# Patient Record
Sex: Female | Born: 1955 | Race: Black or African American | Hispanic: No | Marital: Single | Smoking: Current some day smoker
Health system: Southern US, Community
[De-identification: ages and names within clinical notes are randomized; demographics above are authoritative.]

## PROBLEM LIST (undated history)

## (undated) DIAGNOSIS — E785 Hyperlipidemia, unspecified: Secondary | ICD-10-CM

## (undated) DIAGNOSIS — R030 Elevated blood-pressure reading, without diagnosis of hypertension: Secondary | ICD-10-CM

## (undated) DIAGNOSIS — M1990 Unspecified osteoarthritis, unspecified site: Secondary | ICD-10-CM

## (undated) DIAGNOSIS — T7840XA Allergy, unspecified, initial encounter: Secondary | ICD-10-CM

## (undated) DIAGNOSIS — I10 Essential (primary) hypertension: Secondary | ICD-10-CM

## (undated) DIAGNOSIS — D649 Anemia, unspecified: Secondary | ICD-10-CM

## (undated) DIAGNOSIS — Z720 Tobacco use: Secondary | ICD-10-CM

## (undated) HISTORY — DX: Essential (primary) hypertension: I10

## (undated) HISTORY — PX: COLONOSCOPY: SHX174

## (undated) HISTORY — DX: Elevated blood-pressure reading, without diagnosis of hypertension: R03.0

## (undated) HISTORY — DX: Hyperlipidemia, unspecified: E78.5

## (undated) HISTORY — PX: OTHER SURGICAL HISTORY: SHX169

## (undated) HISTORY — DX: Allergy, unspecified, initial encounter: T78.40XA

---

## 1998-02-21 ENCOUNTER — Emergency Department (HOSPITAL_COMMUNITY): Admission: EM | Admit: 1998-02-21 | Discharge: 1998-02-21 | Payer: Self-pay | Admitting: Emergency Medicine

## 1998-04-15 ENCOUNTER — Emergency Department (HOSPITAL_COMMUNITY): Admission: EM | Admit: 1998-04-15 | Discharge: 1998-04-15 | Payer: Self-pay | Admitting: Emergency Medicine

## 1998-12-01 ENCOUNTER — Emergency Department (HOSPITAL_COMMUNITY): Admission: EM | Admit: 1998-12-01 | Discharge: 1998-12-02 | Payer: Self-pay | Admitting: Emergency Medicine

## 1998-12-02 ENCOUNTER — Encounter: Payer: Self-pay | Admitting: Internal Medicine

## 2006-06-05 ENCOUNTER — Emergency Department (HOSPITAL_COMMUNITY): Admission: EM | Admit: 2006-06-05 | Discharge: 2006-06-05 | Payer: Self-pay | Admitting: Emergency Medicine

## 2010-10-25 ENCOUNTER — Encounter: Payer: Self-pay | Admitting: Internal Medicine

## 2014-10-04 DIAGNOSIS — R030 Elevated blood-pressure reading, without diagnosis of hypertension: Secondary | ICD-10-CM

## 2014-10-04 HISTORY — DX: Elevated blood-pressure reading, without diagnosis of hypertension: R03.0

## 2015-10-16 ENCOUNTER — Ambulatory Visit (INDEPENDENT_AMBULATORY_CARE_PROVIDER_SITE_OTHER): Payer: Self-pay | Admitting: Internal Medicine

## 2015-10-16 ENCOUNTER — Encounter: Payer: Self-pay | Admitting: Internal Medicine

## 2015-10-16 VITALS — BP 158/88 | HR 64 | Resp 16 | Ht 65.5 in | Wt 144.5 lb

## 2015-10-16 DIAGNOSIS — J3089 Other allergic rhinitis: Secondary | ICD-10-CM

## 2015-10-16 NOTE — Progress Notes (Signed)
   Subjective:    Patient ID: Shelley [name], female    DOB: [birth year], 60 y.o.   MRN: [id number]  HPI  1.  Employment physical form needs filling out:  Form scanned in.  2.  Evironmental and seasonal Allergies:  Did not try Fexofenadine.  Has chronic pharyngeal drainage and cough plus itchy watery eyes.  Carpeting throughout house.  No pillow or mattress covers.  No pets in house.    Review of Systems  Constitutional: Negative for activity change and appetite change.  HENT: Positive for postnasal drip.        Ear itching,   Eyes: Positive for redness and itching.       Clear eye discharge  Respiratory: Negative for chest tightness and shortness of breath.   Cardiovascular: Negative for chest pain and palpitations.  Gastrointestinal: Negative for abdominal pain.  Musculoskeletal: Negative for back pain, arthralgias and neck pain.  Neurological: Negative for weakness, light-headedness and numbness.       Objective:   Physical Exam  Constitutional: She is oriented to person, place, and time. She appears well-developed and well-nourished.  Appears anxious with questions and exam  HENT:  Head: Normocephalic and atraumatic.  Eyes: EOM are normal. Pupils are equal, round, and reactive to light. Right eye exhibits discharge.  Conjunctivae injected bilaterally  Neck: Normal range of motion. Neck supple. No thyromegaly present.  Cardiovascular: Normal rate, regular rhythm, normal heart sounds and intact distal pulses.  Exam reveals no friction rub.   No murmur heard. Pulmonary/Chest: Effort normal and breath sounds normal.  Abdominal: Soft. Bowel sounds are normal. She exhibits no mass. There is no tenderness.  Musculoskeletal: Normal range of motion. She exhibits no edema or tenderness.  Lymphadenopathy:    She has no cervical adenopathy.  Neurological: She is alert and oriented to person, place, and time. She has normal reflexes. No cranial nerve deficit. Coordination normal.    Skin: Skin is warm and dry. No erythema.  Psychiatric: Her mood appears anxious.          Assessment & Plan:  1.  Staff Medical Report Form filled out for work.  Pt. Had PPD elsewhere and will bring that in.  No concerns.   2.  Elevated BP again, but patient very anxious. Will have her return in 2 weeks only for bp check.  3.  Anxiety:  Scored much better on PHQ9(3) today compared to previous 10. Warm hand off to [employer], LCSW today.  Suspect patient will not follow up with counseling  4.  Health Maintenance:  Pt. Not interested in preventive care measures today.  Not completely due to concern for more cost.  No interest in pap, mammogram, dental, laboratory at this time.

## 2015-10-16 NOTE — Patient Instructions (Signed)
Can google "advance directives, [place]"  And bring up form from Secretary of State. Print and fill out Or can go to "5 wishes"  Which is also in Spanish and fill out--this costs $5--perhaps easier to use. Designate a Medical Power of Attorney to speak for you if you are unable to speak for yourself when ill or injured  

## 2015-11-07 ENCOUNTER — Other Ambulatory Visit: Payer: No Typology Code available for payment source

## 2017-01-05 ENCOUNTER — Encounter (HOSPITAL_COMMUNITY): Payer: Self-pay | Admitting: Family Medicine

## 2017-01-05 ENCOUNTER — Ambulatory Visit (HOSPITAL_COMMUNITY)
Admission: EM | Admit: 2017-01-05 | Discharge: 2017-01-05 | Disposition: A | Discharge status: Home / Self Care | Payer: Self-pay | Attending: Internal Medicine | Admitting: Internal Medicine

## 2017-01-05 DIAGNOSIS — J069 Acute upper respiratory infection, unspecified: Secondary | ICD-10-CM

## 2017-01-05 MED ORDER — AZITHROMYCIN 250 MG PO TABS: 250.0000 mg | ORAL_TABLET | Freq: Every day | ORAL | 0 refills | Status: DC

## 2017-01-05 MED ORDER — BENZONATATE 100 MG PO CAPS: 100.0000 mg | ORAL_CAPSULE | Freq: Three times a day (TID) | ORAL | 0 refills | Status: DC

## 2017-01-05 NOTE — Discharge Instructions (Signed)

## 2017-01-05 NOTE — ED Notes (Signed)
Patient requested to speak to provider.  Notified provider, provider at bedside

## 2017-01-05 NOTE — ED Provider Notes (Signed)
CSN: [id number]     Arrival date & time 01/05/17  1203 History   First MD Initiated Contact with Patient 01/05/17 1237     Chief Complaint  Patient presents with  . URI   (Consider location/radiation/quality/duration/timing/severity/associated sxs/prior Treatment) HPI Shelley [name] is a 61 y.o. female presenting to UC with c/o cough, congestion, intermittent ear pain, and sore throat for about 4 weeks now. She has taken OTC mucinex with mild relief. Denies sick contacts or recent travel. Denies known fever. No n/v/d. No hx of asthma.    Past Medical History:  Diagnosis Date  . Allergy    Environmental and Seasonal:  Pharyngeal drainage, cough and eye symptoms  . Elevated blood pressure reading without diagnosis of hypertension 2016   Past Surgical History:  Procedure Laterality Date  . 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 Heart disease Mother   . Hypertension Mother   . Congestive Heart Failure Mother     Cause of death  . Congestive Heart Failure Father   . Stroke Father     Cause of death  . Hypertension Father   . Alcohol abuse Brother   . Hypertension Brother   . Heart disease Maternal Grandmother   . Stroke Maternal Grandmother   . Diabetes Sister   . Hypertension Sister   . Heart disease Sister     Arrhythmia and myocarditis/Congenital Heart Disease/possible arrhythmia  . Diabetes Paternal Grandmother   . Diabetes Paternal Grandfather    Social History  Substance Use Topics  . Smoking status: Current Some Day Smoker    Types: Cigarettes    Start date: 10/09/1990  . Smokeless tobacco: Never Used  . Alcohol use 0.0 oz/week     Comment: Couple glasses of wine once to twice weekly   OB History    Gravida Para Term Preterm AB Living   3 2 2   1 2    SAB TAB Ectopic Multiple Live Births   1             Review of Systems  Constitutional: Negative for chills and fever.  HENT:  Positive for congestion, ear pain, postnasal drip, rhinorrhea and sore throat. Negative for trouble swallowing and voice change.   Respiratory: Positive for cough. Negative for shortness of breath.   Cardiovascular: Negative for chest pain and palpitations.  Gastrointestinal: Negative for abdominal pain, diarrhea, nausea and vomiting.  Musculoskeletal: Negative for arthralgias, back pain and myalgias.  Skin: Negative for rash.  Neurological: Positive for headaches. Negative for dizziness and light-headedness.    Allergies  Cephalexin  Home Medications   Prior to Admission medications   Medication Sig Start Date End Date Taking? Authorizing Provider  azithromycin (ZITHROMAX) 250 MG tablet Take 1 tablet (250 mg total) by mouth daily. Take first 2 tablets together, then 1 every day until finished. 01/05/17   Noland Fordyce, PA-C  benzonatate (TESSALON) 100 MG capsule Take 1 capsule (100 mg total) by mouth every 8 (eight) hours. 01/05/17   Noland Fordyce, PA-C   Meds Ordered and Administered this Visit  Medications - No data to display  BP (!) 155/64   Pulse 79   Temp 98.6 F (37 C) (Oral)   Resp 18   LMP 10/04/1998   SpO2 97%  No data found.   Physical Exam  Constitutional: She is oriented to person, place, and time. She appears well-developed and  well-nourished. No distress.  HENT:  Head: Normocephalic and atraumatic.  Right Ear: Tympanic membrane normal.  Left Ear: Tympanic membrane normal.  Nose: Nose normal.  Mouth/Throat: Uvula is midline, oropharynx is clear and moist and mucous membranes are normal.  Eyes: EOM are normal.  Neck: Normal range of motion. Neck supple.  Cardiovascular: Normal rate and regular rhythm.   Pulmonary/Chest: Effort normal. No stridor. No respiratory distress. She has wheezes (faint expiratory wheeze in upper lung field). She has no rales.  Musculoskeletal: Normal range of motion.  Lymphadenopathy:    She has no cervical adenopathy.  Neurological:  She is alert and oriented to person, place, and time.  Skin: Skin is warm and dry. She is not diaphoretic.  Psychiatric: She has a normal mood and affect. Her behavior is normal.  Nursing note and vitals reviewed.   Urgent Care Course     Procedures (including critical care time)  Labs Review Labs Reviewed - No data to display  Imaging Review No results found.    MDM   1. Upper respiratory tract infection, unspecified type    Pt c/o 4 weeks of URI symptoms. Will cover for bacterial cause of URI Rx: azithromycin and tessalon  Encouraged fluids and rest.  f/u with PCP in 1 week if not improving, sooner if worsening.    Noland Fordyce, PA-C 01/05/17 1244

## 2017-01-05 NOTE — ED Triage Notes (Signed)
Pt here for URI symptoms, sts sinus drainage, cough, sore throat.

## 2017-08-03 ENCOUNTER — Ambulatory Visit: Payer: Self-pay | Admitting: Internal Medicine

## 2017-08-06 ENCOUNTER — Emergency Department (HOSPITAL_COMMUNITY)
Admission: EM | Admit: 2017-08-06 | Discharge: 2017-08-06 | Disposition: A | Discharge status: Home / Self Care | Payer: Self-pay | Attending: Emergency Medicine | Admitting: Emergency Medicine

## 2017-08-06 ENCOUNTER — Encounter (HOSPITAL_COMMUNITY): Payer: Self-pay | Admitting: Emergency Medicine

## 2017-08-06 DIAGNOSIS — H109 Unspecified conjunctivitis: Secondary | ICD-10-CM | POA: Insufficient documentation

## 2017-08-06 DIAGNOSIS — F17210 Nicotine dependence, cigarettes, uncomplicated: Secondary | ICD-10-CM | POA: Insufficient documentation

## 2017-08-06 DIAGNOSIS — Z79899 Other long term (current) drug therapy: Secondary | ICD-10-CM | POA: Insufficient documentation

## 2017-08-06 MED ORDER — CIPROFLOXACIN HCL 0.3 % OP SOLN: 2.0000 [drp] | OPHTHALMIC | 0 refills | Status: DC

## 2017-08-06 NOTE — ED Triage Notes (Signed)
Pt. Stated, I have pink eye for over 2 weeks and in my house and I went to UC and gave me drops and its worse instead of better especially right eye

## 2017-08-06 NOTE — ED Provider Notes (Signed)
[facility] EMERGENCY DEPARTMENT Provider Note   CSN: [id number] Arrival date & time: 08/06/17  5852     History   Chief Complaint Chief Complaint  Patient presents with  . Eye Problem  . Conjunctivitis    HPI Shelley [name] is a 61 y.o. female.  HPI   Shelley [name] is a 61 yo female with a history of allergies, hypertension who presents the emergency department for evaluation of pinkeye.  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  States that she developed eye drainage and redness about a week and a half ago.  States that she also passed it along to her daughter at home, who was seen at the urgent care clinic and given antibiotic drops.  She states that she has been using her daughter's antibiotic drops for the past two days without improvement of her symptoms.  States that she has purulent drainage from the left eye and crusting when she wakes up in the morning.  Denies eye pain, photophobia, headache, fever, vision loss.  Denies recent trauma or injury to the eye.  She does not wear contact lenses or eyeglasses.  She states that she has recently had an upper respiratory tract infection with congestion, dry cough.  Past Medical History:  Diagnosis Date  . Allergy    Environmental and Seasonal:  Pharyngeal drainage, cough and eye symptoms  . Elevated blood pressure reading without diagnosis of hypertension 2016    Patient Active Problem List   Diagnosis Date Noted  . Environmental and seasonal allergies 10/16/2015    Past Surgical History:  Procedure Laterality Date  . 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 Authorizing Provider    azithromycin (ZITHROMAX) 250 MG tablet Take 1 tablet (250 mg total) by mouth daily. Take first 2 tablets together, then 1 every day until finished. 01/05/17   Noe Gens, PA-C  benzonatate (TESSALON) 100 MG capsule Take 1 capsule (100 mg total) by mouth every 8 (eight) hours. 01/05/17   Noe Gens, PA-C  ciprofloxacin (CILOXAN) 0.3 % ophthalmic solution Place 2 drops into the left eye every 4 (four) hours while awake. Administer 1 drop, every 2 hours, while awake, for 2 days. Then 1 drop, every 4 hours, while awake, for the next 5 days. 08/06/17   Glyn Ade, PA-C    Family History Family History  Problem Relation Age of Onset  . Heart disease Mother   . Hypertension Mother   . Congestive Heart Failure Mother        Cause of death  . Congestive Heart Failure Father   . Stroke Father        Cause of death  . Hypertension Father   . Alcohol abuse Brother   . Hypertension Brother   . Heart disease Maternal Grandmother   . Stroke Maternal Grandmother   . Diabetes Sister   . Hypertension Sister   . Heart disease Sister        Arrhythmia and  myocarditis/Congenital Heart Disease/possible arrhythmia  . Diabetes Paternal Grandmother   . Diabetes Paternal Grandfather     Social History Social History  Substance Use Topics  . Smoking status: Current Some Day Smoker    Types: Cigarettes    Start date: 10/09/1990  . Smokeless tobacco: Never Used  . Alcohol use 0.0 oz/week     Comment: Couple glasses of wine once to twice weekly     Allergies   Cephalexin   Review of Systems Review of Systems  Constitutional: Negative for chills, fatigue and fever.  HENT: Positive for congestion and rhinorrhea. Negative for ear pain, facial swelling, hearing loss, sinus pain, sinus pressure, sore throat and trouble swallowing.   Eyes: Positive for discharge and redness. Negative for photophobia, pain, itching and visual disturbance.  Respiratory: Positive for cough. Negative for  shortness of breath.   Neurological: Negative for headaches.     Physical Exam Updated Vital Signs BP (!) 143/71 (BP Location: Right Arm)   Pulse 78   Temp 98.4 F (36.9 C) (Oral)   Resp 18   Ht 5\' 11"  (1.803 m)   Wt 85.3 kg (188 lb)   LMP  (LMP Unknown)   SpO2 97%   BMI 26.22 kg/m   Physical Exam  Constitutional: She appears well-developed and well-nourished. No distress.  HENT:  Head: Normocephalic and atraumatic.  Mouth/Throat: Oropharynx is clear and moist. No oropharyngeal exudate.  Clear rhinorrhea noted in the nasal cavity.  Eyes: Pupils are equal, round, and reactive to light. EOM are normal. Right eye exhibits no discharge. Left eye exhibits no discharge.  Visual acuity: 20/32RE, 20/50LE, 20/32 BE. Conjunctiva of right eye injected with copious amount of clear drainage.  Chemosis noted on the lateral aspect of the left eye.  Mild left upper eyelid swelling compared to right.  No eyelid erythema, warmth, tenderness, induration, stye.  No foreign body with eyelids flipped.  Optic disc with sharp margins on funduscopic exam.  Neck: Normal range of motion. Neck supple. No tracheal deviation present.  Cardiovascular: Normal rate and regular rhythm.  Exam reveals no friction rub.   No murmur heard. Pulmonary/Chest: Effort normal and breath sounds normal. No respiratory distress. She has no wheezes. She has no rales.  Lymphadenopathy:    She has no cervical adenopathy.  Neurological: She is alert. Coordination normal.  Skin: She is not diaphoretic.  Psychiatric: She has a normal mood and affect. Her behavior is normal.  Nursing note and vitals reviewed.    ED Treatments / Results  Labs (all labs ordered are listed, but only abnormal results are displayed) Labs Reviewed - No data to display  EKG  EKG Interpretation None       Radiology No results found.  Procedures Procedures (including critical care time)  Medications Ordered in ED Medications - No data to  display   Initial Impression / Assessment and Plan / ED Course  I have reviewed the triage vital signs and the nursing notes.  Pertinent labs & imaging results that were available during my care of the patient were reviewed by me and considered in my medical decision making (see chart for details).     Shelley [name] presents with symptoms consistent with bacterial conjunctivitis.  Purulent discharge exam with chemosis on the lateral aspect of the left eye.  No evidence of preseptal or orbital cellulitis.  Pt is not a contact lens wearer.  Patient will be given ciprofloxacin ophthalmic solution.  Personal hygiene and frequent handwashing discussed.  Patient advised to followup with ophthalmologist for reevaluation in several days. Patient verbalizes understanding and is agreeable with discharge. Dr. Alvino Chapel also saw this patient and agrees with plan.   Final Clinical Impressions(s) / ED Diagnoses   Final diagnoses:  Conjunctivitis of left eye, unspecified conjunctivitis type    New Prescriptions Discharge Medication List as of 08/06/2017 11:18 AM    START taking these medications   Details  ciprofloxacin (CILOXAN) 0.3 % ophthalmic solution Place 2 drops into the left eye every 4 (four) hours while awake. Administer 1 drop, every 2 hours, while awake, for 2 days. Then 1 drop, every 4 hours, while awake, for the next 5 days., Starting Sat 08/06/2017, Print         Glyn Ade, PA-C 08/06/17 1712    Davonna Belling, MD 08/07/17 1655

## 2017-08-06 NOTE — ED Triage Notes (Signed)
Pt. Used some of her daughters eye drops that was prescribed by the UC.

## 2017-08-06 NOTE — ED Notes (Signed)
Declined W/C at D/C and was escorted to lobby by RN. 

## 2017-08-06 NOTE — Discharge Instructions (Signed)
I have written you a prescription for an antibiotic.  Please have this filled and take 2 drops every 4 hours.  If your symptoms are not improving in 3 days, please contact ophthalmology.  I have listed information to the eye doctor below.  Please try to avoid rubbing your eyes, wash your hands frequently and wash your pillows and sheets to prevent reinfection.  I have also listed the information to [facility].  This is a clinic who accept patients who do not have insurance.  They are located near [facility].  Please schedule an appointment to establish care and for follow-up of your blood pressure which was elevated in the ER today.  Return to the emergency department if you develop worsening eye pain, have worsening vision loss, have a headache or fever.  Please also return if you have any new or worsening symptoms.

## 2017-09-22 ENCOUNTER — Ambulatory Visit (INDEPENDENT_AMBULATORY_CARE_PROVIDER_SITE_OTHER): Payer: Self-pay | Admitting: Physician Assistant

## 2017-10-04 DIAGNOSIS — J189 Pneumonia, unspecified organism: Secondary | ICD-10-CM

## 2017-10-04 HISTORY — DX: Pneumonia, unspecified organism: J18.9

## 2018-07-25 ENCOUNTER — Encounter (HOSPITAL_COMMUNITY): Payer: Self-pay | Admitting: Emergency Medicine

## 2018-07-25 ENCOUNTER — Other Ambulatory Visit: Payer: Self-pay

## 2018-07-25 ENCOUNTER — Emergency Department (HOSPITAL_COMMUNITY): Payer: BLUE CROSS/BLUE SHIELD

## 2018-07-25 ENCOUNTER — Inpatient Hospital Stay (HOSPITAL_COMMUNITY)
Admission: EM | Admit: 2018-07-25 | Discharge: 2018-07-28 | DRG: 871 | Disposition: A | Discharge status: Home / Self Care | Payer: BLUE CROSS/BLUE SHIELD | Attending: Internal Medicine | Admitting: Internal Medicine

## 2018-07-25 DIAGNOSIS — F1010 Alcohol abuse, uncomplicated: Secondary | ICD-10-CM | POA: Diagnosis present

## 2018-07-25 DIAGNOSIS — J189 Pneumonia, unspecified organism: Secondary | ICD-10-CM | POA: Diagnosis present

## 2018-07-25 DIAGNOSIS — Z79899 Other long term (current) drug therapy: Secondary | ICD-10-CM

## 2018-07-25 DIAGNOSIS — F17210 Nicotine dependence, cigarettes, uncomplicated: Secondary | ICD-10-CM | POA: Diagnosis present

## 2018-07-25 DIAGNOSIS — R17 Unspecified jaundice: Secondary | ICD-10-CM | POA: Diagnosis present

## 2018-07-25 DIAGNOSIS — Z720 Tobacco use: Secondary | ICD-10-CM | POA: Diagnosis present

## 2018-07-25 DIAGNOSIS — F1090 Alcohol use, unspecified, uncomplicated: Secondary | ICD-10-CM

## 2018-07-25 DIAGNOSIS — Z881 Allergy status to other antibiotic agents status: Secondary | ICD-10-CM | POA: Diagnosis not present

## 2018-07-25 DIAGNOSIS — A419 Sepsis, unspecified organism: Secondary | ICD-10-CM | POA: Diagnosis present

## 2018-07-25 DIAGNOSIS — Z7289 Other problems related to lifestyle: Secondary | ICD-10-CM

## 2018-07-25 DIAGNOSIS — J181 Lobar pneumonia, unspecified organism: Secondary | ICD-10-CM

## 2018-07-25 DIAGNOSIS — Z789 Other specified health status: Secondary | ICD-10-CM

## 2018-07-25 HISTORY — DX: Tobacco use: Z72.0

## 2018-07-25 LAB — COMPREHENSIVE METABOLIC PANEL
ALBUMIN: 4.2 g/dL (ref 3.5–5.0)
ALT: 16 U/L (ref 0–44)
AST: 21 U/L (ref 15–41)
Alkaline Phosphatase: 90 U/L (ref 38–126)
Anion gap: 12 (ref 5–15)
BUN: 10 mg/dL (ref 8–23)
CALCIUM: 9.7 mg/dL (ref 8.9–10.3)
CHLORIDE: 102 mmol/L (ref 98–111)
CO2: 24 mmol/L (ref 22–32)
CREATININE: 0.91 mg/dL (ref 0.44–1.00)
GFR calc non Af Amer: >60 mL/min (ref >60)
GLUCOSE: 107 mg/dL — AB (ref 70–99)
Potassium: 4.1 mmol/L (ref 3.5–5.1)
SODIUM: 138 mmol/L (ref 135–145)
Total Bilirubin: 1.5 mg/dL — ABNORMAL HIGH (ref 0.3–1.2)
Total Protein: 8.2 g/dL — ABNORMAL HIGH (ref 6.5–8.1)

## 2018-07-25 LAB — CBC WITH DIFFERENTIAL/PLATELET
Abs Immature Granulocytes: 0.17 10*3/uL — ABNORMAL HIGH (ref 0.00–0.07)
Basophils Absolute: 0 10*3/uL (ref 0.0–0.1)
Basophils Relative: 0 %
Eosinophils Absolute: 0 10*3/uL (ref 0.0–0.5)
Eosinophils Relative: 0 %
HEMATOCRIT: 45.1 % (ref 36.0–46.0)
HEMOGLOBIN: 14.9 g/dL (ref 12.0–15.0)
IMMATURE GRANULOCYTES: 1 %
LYMPHS ABS: 0.7 10*3/uL (ref 0.7–4.0)
Lymphocytes Relative: 3 %
MCH: 30.8 pg (ref 26.0–34.0)
MCHC: 33 g/dL (ref 30.0–36.0)
MCV: 93.2 fL (ref 80.0–100.0)
MONO ABS: 1.3 10*3/uL — AB (ref 0.1–1.0)
MONOS PCT: 5 %
NEUTROS ABS: 23 10*3/uL — AB (ref 1.7–7.7)
NEUTROS PCT: 91 %
Platelets: 246 10*3/uL (ref 150–400)
RBC: 4.84 MIL/uL (ref 3.87–5.11)
RDW: 13.4 % (ref 11.5–15.5)
WBC: 25.2 10*3/uL — ABNORMAL HIGH (ref 4.0–10.5)
nRBC: 0 % (ref 0.0–0.2)

## 2018-07-25 LAB — PROCALCITONIN: PROCALCITONIN: 2.85 ng/mL

## 2018-07-25 LAB — URINALYSIS, ROUTINE W REFLEX MICROSCOPIC
Bilirubin Urine: NEGATIVE
GLUCOSE, UA: NEGATIVE mg/dL
Ketones, ur: NEGATIVE mg/dL
NITRITE: NEGATIVE
PH: 5 (ref 5.0–8.0)
Protein, ur: NEGATIVE mg/dL
Specific Gravity, Urine: 1.017 (ref 1.005–1.030)

## 2018-07-25 LAB — PROTIME-INR
INR: 1.26
Prothrombin Time: 15.7 seconds — ABNORMAL HIGH (ref 11.4–15.2)

## 2018-07-25 LAB — LACTIC ACID, PLASMA: LACTIC ACID, VENOUS: 1.3 mmol/L (ref 0.5–1.9)

## 2018-07-25 LAB — I-STAT CG4 LACTIC ACID, ED
Lactic Acid, Venous: 2.01 mmol/L (ref 0.5–1.9)
Lactic Acid, Venous: 2.06 mmol/L (ref 0.5–1.9)

## 2018-07-25 LAB — I-STAT TROPONIN, ED: Troponin i, poc: 0.01 ng/mL (ref 0.00–0.08)

## 2018-07-25 LAB — MRSA PCR SCREENING: MRSA by PCR: NEGATIVE

## 2018-07-25 LAB — MAGNESIUM: MAGNESIUM: 1.8 mg/dL (ref 1.7–2.4)

## 2018-07-25 LAB — APTT: APTT: 29 s (ref 24–36)

## 2018-07-25 LAB — PHOSPHORUS: PHOSPHORUS: 3.3 mg/dL (ref 2.5–4.6)

## 2018-07-25 MED ORDER — KETOROLAC TROMETHAMINE 30 MG/ML IJ SOLN
30.0000 mg | Freq: Once | INTRAMUSCULAR | Status: AC
  Administered 2018-07-25: 30 mg via INTRAVENOUS
  Filled 2018-07-25: qty 1

## 2018-07-25 MED ORDER — METHYLPREDNISOLONE SODIUM SUCC 125 MG IJ SOLR
125.0000 mg | Freq: Once | INTRAMUSCULAR | Status: AC
  Administered 2018-07-25: 125 mg via INTRAVENOUS
  Filled 2018-07-25: qty 2

## 2018-07-25 MED ORDER — ONDANSETRON HCL 4 MG/2ML IJ SOLN
4.0000 mg | Freq: Once | INTRAMUSCULAR | Status: AC
  Administered 2018-07-25: 4 mg via INTRAVENOUS
  Filled 2018-07-25: qty 2

## 2018-07-25 MED ORDER — DM-GUAIFENESIN ER 30-600 MG PO TB12
1.0000 | ORAL_TABLET | Freq: Two times a day (BID) | ORAL | Status: DC
  Administered 2018-07-25 – 2018-07-28 (×6): 1 via ORAL
  Filled 2018-07-25 (×6): qty 1

## 2018-07-25 MED ORDER — HEPARIN SODIUM (PORCINE) 5000 UNIT/ML IJ SOLN
5000.0000 [IU] | Freq: Three times a day (TID) | INTRAMUSCULAR | Status: DC
  Administered 2018-07-25 – 2018-07-27 (×5): 5000 [IU] via SUBCUTANEOUS
  Filled 2018-07-25 (×5): qty 1

## 2018-07-25 MED ORDER — SODIUM CHLORIDE 0.9 % IV SOLN
500.0000 mg | Freq: Once | INTRAVENOUS | Status: AC
  Administered 2018-07-25: 500 mg via INTRAVENOUS
  Filled 2018-07-25: qty 500

## 2018-07-25 MED ORDER — SODIUM CHLORIDE 0.9 % IV SOLN
1.0000 g | Freq: Once | INTRAVENOUS | Status: AC
  Administered 2018-07-25: 1 g via INTRAVENOUS
  Filled 2018-07-25: qty 10

## 2018-07-25 MED ORDER — SODIUM CHLORIDE 0.9 % IV BOLUS
1000.0000 mL | Freq: Once | INTRAVENOUS | Status: AC
  Administered 2018-07-25: 1000 mL via INTRAVENOUS

## 2018-07-25 MED ORDER — SODIUM CHLORIDE 0.9 % IV SOLN
1.0000 g | INTRAVENOUS | Status: DC
  Administered 2018-07-26: 1 g via INTRAVENOUS
  Filled 2018-07-25 (×2): qty 10

## 2018-07-25 MED ORDER — ONDANSETRON HCL 4 MG PO TABS: 4.0000 mg | ORAL_TABLET | Freq: Four times a day (QID) | ORAL | Status: DC | PRN

## 2018-07-25 MED ORDER — SODIUM CHLORIDE 0.9 % IV BOLUS
1500.0000 mL | Freq: Once | INTRAVENOUS | Status: AC
  Administered 2018-07-25: 1000 mL via INTRAVENOUS

## 2018-07-25 MED ORDER — SODIUM CHLORIDE 0.9 % IV SOLN
500.0000 mg | INTRAVENOUS | Status: DC
  Administered 2018-07-26: 500 mg via INTRAVENOUS
  Filled 2018-07-25 (×2): qty 500

## 2018-07-25 MED ORDER — IPRATROPIUM-ALBUTEROL 0.5-2.5 (3) MG/3ML IN SOLN: 3.0000 mL | RESPIRATORY_TRACT | Status: DC | PRN

## 2018-07-25 MED ORDER — LORAZEPAM 2 MG/ML IJ SOLN: 2.0000 mg | INTRAMUSCULAR | Status: DC | PRN

## 2018-07-25 MED ORDER — ONDANSETRON HCL 4 MG/2ML IJ SOLN: 4.0000 mg | Freq: Four times a day (QID) | INTRAMUSCULAR | Status: DC | PRN

## 2018-07-25 NOTE — ED Triage Notes (Signed)
Pt states productive cough for 3 weeks with white phlegm. She states she has been dizzy since yesterday, feeling like she will fall when she stands. States this morning she woke up with a fever and chills. Denies chest pain. Does endorse some shortness of breath. Smokes daily.

## 2018-07-25 NOTE — H&P (Signed)
History and Physical    Shelley [name] ZOX:[id number] DOB: 05/26/1956 DOA: 07/25/2018  PCP: Mack Hook, MD   Patient coming from: Home.  I have personally briefly reviewed patient's old medical records in [place]  Chief Complaint: Shortness of breath.  HPI: Shelley [name] is a 61 y.o. female with medical history significant of allergies, tobacco use, elevated blood pressure without a formal diagnosis of hypertension who is coming to the emergency department with complaints of progressively worse dyspnea for the past 3 weeks.  She states that about 3 weeks ago she had sore throat for 1 day associated with a dry cough.  She states that she "was unable to get over the cold" and develop a productive cough of yellowish sputum.  She continues to smoke daily and has had wheezing.  She has been having fever, chills with malaise since this morning.  She has pleuritic chest pain.  She has been feeling dizzy since yesterday.  She denies hemoptysis, PND, or pitting edema of the lower extremities.  She denies abdominal pain, but had one episode of emesis while being interviewed and examined.  No diarrhea or constipation.  She has had mild dysuria, but denies frequency or hematuria.  No polyuria, polydipsia, polyphagia or blurred vision.  ED Course: Initial vital signs temperature 102.1, degrees Fahrenheit, pulse 121, respirations 18, blood pressure 154/89 mmHg and O2 sat 93% on room air.  The patient received a 1 L normal saline bolus, ceftriaxone and azithromycin IVPB.  I added Zofran 4 mg IVP x1, methylprednisolone 125 mg IVP x1, a 1500 mL NS bolus and Toradol 30 mg IVP x1 dose.  Her urinalysis was cloudy in appearance with small hemoglobinuria and small leukocyte esterase.  11-20 WBC per hpf with many bacteria.  White count was 25.2 with 91% neutrophils, hemoglobin 14.9 g/dL and platelets 246.  Her CMP shows a glucose of 107, total bilirubin of 1.5 mg/dL.  Total protein was 8.2 g/dL.  All  other CMP values are within normal limits.  Lactic acid # 1 2.01 and lactic acid # 2 was 2.06 mmol/L.Her chest radiograph showed patchy infiltrate consistent with pneumonia in the superior aspect of the right middle lobe.  Lungs elsewhere are clear.  Review of Systems: As per HPI otherwise 10 point review of systems negative.   Past Medical History:  Diagnosis Date  . Allergy    Environmental and Seasonal:  Pharyngeal drainage, cough and eye symptoms  . Elevated blood pressure reading without diagnosis of hypertension 2016  . Tobacco use     Past Surgical History:  Procedure Laterality Date  . 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 She started smoking about 27 years ago. She has never used smokeless tobacco. She reports that she drinks about 14.0 standard drinks of alcohol per week. She reports that she does not use drugs.  Allergies  Allergen Reactions  . Cephalexin Diarrhea    Developed diarrhea after only 2 doses.    Family History  Problem Relation Age of Onset  . Heart disease Mother   . Hypertension Mother   . Congestive Heart Failure Mother        Cause of death  . Congestive Heart Failure Father   . Stroke Father        Cause of death  . Hypertension Father   . Alcohol abuse Brother   . Hypertension Brother   .  Heart disease Maternal Grandmother   . Stroke Maternal Grandmother   . Diabetes Sister   . Hypertension Sister   . Heart disease Sister        Arrhythmia and myocarditis/Congenital Heart Disease/possible arrhythmia  . Diabetes Paternal Grandmother   . Diabetes Paternal Grandfather    Prior to Admission medications   Medication Sig Start Date End Date Taking? Authorizing Provider  DM-Doxylamine-Acetaminophen (NYQUIL COLD & FLU PO) Take 22.5 mLs by mouth as needed (cold and cough).   Yes [provider]  azithromycin (ZITHROMAX) 250 MG tablet Take 1 tablet (250 mg  total) by mouth daily. Take first 2 tablets together, then 1 every day until finished. Patient not taking: Reported on 07/25/2018 01/05/17   Noe Gens, PA-C  benzonatate (TESSALON) 100 MG capsule Take 1 capsule (100 mg total) by mouth every 8 (eight) hours. Patient not taking: Reported on 07/25/2018 01/05/17   Noe Gens, PA-C  ciprofloxacin (CILOXAN) 0.3 % ophthalmic solution Place 2 drops into the left eye every 4 (four) hours while awake. Administer 1 drop, every 2 hours, while awake, for 2 days. Then 1 drop, every 4 hours, while awake, for the next 5 days. Patient not taking: Reported on 07/25/2018 08/06/17   Glyn Ade, PA-C    Physical Exam: Vitals:   07/25/18 1600 07/25/18 1630 07/25/18 1818 07/25/18 1825  BP: 115/63 125/66  133/69  Pulse: (!) 109 (!) 106    Resp: 19 20    Temp:    98.2 F (36.8 C)  TempSrc:    Oral  SpO2: 99% 98%    Weight:   60.8 kg   Height:   5\' 6"  (1.676 m)     Constitutional:  Febrile.  Looks acutely ill. Eyes: PERRL, lids and conjunctivae are injected. ENMT: Mucous membranes are dry. Posterior pharynx clear of any exudate or lesions. Neck: normal, supple, no masses, no thyromegaly Respiratory: Tachypneic at 28 bpm.  Bibasilar crackles with bilateral wheezing and rhonchi.  No accessory muscle use.  Cardiovascular: Tachycardic at 120 bpm, no murmurs / rubs / gallops. No extremity edema. 2+ pedal pulses. No carotid bruits.  Abdomen: Soft, mild epigastric tenderness, no masses palpated. No hepatosplenomegaly. Bowel sounds positive.  Musculoskeletal: no clubbing / cyanosis. Good ROM, no contractures. Normal muscle tone.  Skin: no rashes, lesions, ulcers. No induration on limited dermatological examination. Neurologic: CN 2-12 grossly intact. Sensation intact, DTR normal. Strength 5/5 in all 4.  Psychiatric: Normal judgment and insight. Alert and oriented x 4.  Mildly anxious mood.   Labs on Admission: I have personally reviewed following labs  and imaging studies  CBC: Recent Labs  Lab 07/25/18 0856  WBC 25.2*  NEUTROABS 23.0*  HGB 14.9  HCT 45.1  MCV 93.2  PLT 716   Basic Metabolic Panel: Recent Labs  Lab 07/25/18 0856  NA 138  K 4.1  CL 102  CO2 24  GLUCOSE 107*  BUN 10  CREATININE 0.91  CALCIUM 9.7   GFR: Estimated Creatinine Clearance: 59.2 mL/min (by C-G formula based on SCr of 0.91 mg/dL). Liver Function Tests: Recent Labs  Lab 07/25/18 0856  AST 21  ALT 16  ALKPHOS 90  BILITOT 1.5*  PROT 8.2*  ALBUMIN 4.2   No results for input(s): LIPASE, AMYLASE in the last 168 hours. No results for input(s): AMMONIA in the last 168 hours. Coagulation Profile: Recent Labs  Lab 07/25/18 1544  INR 1.26   Cardiac Enzymes: No results for input(s): CKTOTAL, CKMB,  CKMBINDEX, TROPONINI in the last 168 hours. BNP (last 3 results) No results for input(s): PROBNP in the last 8760 hours. HbA1C: No results for input(s): HGBA1C in the last 72 hours. CBG: No results for input(s): GLUCAP in the last 168 hours. Lipid Profile: No results for input(s): CHOL, HDL, LDLCALC, TRIG, CHOLHDL, LDLDIRECT in the last 72 hours. Thyroid Function Tests: No results for input(s): TSH, T4TOTAL, FREET4, T3FREE, THYROIDAB in the last 72 hours. Anemia Panel: No results for input(s): VITAMINB12, FOLATE, FERRITIN, TIBC, IRON, RETICCTPCT in the last 72 hours. Urine analysis:    Component Value Date/Time   COLORURINE YELLOW 07/25/2018 0851   APPEARANCEUR CLOUDY (A) 07/25/2018 0851   LABSPEC 1.017 07/25/2018 0851   PHURINE 5.0 07/25/2018 0851   GLUCOSEU NEGATIVE 07/25/2018 0851   HGBUR SMALL (A) 07/25/2018 0851   BILIRUBINUR NEGATIVE 07/25/2018 0851   KETONESUR NEGATIVE 07/25/2018 0851   PROTEINUR NEGATIVE 07/25/2018 0851   NITRITE NEGATIVE 07/25/2018 0851   LEUKOCYTESUR SMALL (A) 07/25/2018 0851    Radiological Exams on Admission: Dg Chest 2 View  Result Date: 07/25/2018 CLINICAL DATA:  Cough and dizziness EXAM: CHEST - 2  VIEW COMPARISON:  None. FINDINGS: There is patchy infiltrate in the superior aspect of the right middle lobe. Lungs elsewhere are clear. Heart size and pulmonary vascularity are normal. No adenopathy. There are foci of degenerative change in the midthoracic region. IMPRESSION: Patchy infiltrate, consistent with pneumonia in the superior aspect of the right middle lobe. Lungs elsewhere clear. No adenopathy evident. Followup PA and lateral chest radiographs recommended in 3-4 weeks following trial of antibiotic therapy to ensure resolution and exclude underlying malignancy. Electronically Signed   By: Lowella Grip III M.D.   On: 07/25/2018 09:38    EKG: Independently reviewed.  Vent. rate 126 BPM PR interval 114 ms QRS duration 80 ms QT/QTc 308/446 ms P-R-T axes 75 41 80 Sinus tachycardia Right atrial enlargement Borderline ECG  Assessment/Plan Principal Problem:   CAP (community acquired pneumonia) Currently the patient looks very toxic tachypneic in the mid 30s, tachycardic in the 130s and had an episode of emesis while being examined.  Admit to stepdown/inpatient. Continue supplemental oxygen. Continue bronchodilators as needed. Mucinex DM p.o. twice daily. Continue IV fluids. Continue ceftriaxone and azithromycin. Check strep pneumonia urinary antigen. Check sputum Gram stain, culture and sensitivity. Follow-up blood cultures and sensitivity.  Active Problems:   Tobacco use Nicotine replacement therapy ordered. Staff to provide tobacco cessation information    Elevated bilirubin Likely due to EtOH abuse. Trend level daily.    Alcohol use CiWA protocol started.   DVT prophylaxis: Heparin SQ. Code Status: Full code. Family Communication:  Disposition Plan: Admit for IV antibiotics for at least 2 to 3 days. Consults called:  Admission status: Inpatient/stepdown.   Reubin Milan MD [facility] Pager [phone].  If 7PM-7AM, please contact  night-coverage [email or web address] Password [facility]  07/25/2018, 6:28 PM

## 2018-07-25 NOTE — ED Notes (Signed)
Date and time results received: 07/25/18 0913   Test: Lactic acid Critical Value: 2.01  Name of Provider Notified: Dr. Francia Greaves   Orders Received? Or Actions Taken?: EDP to see patient.

## 2018-07-25 NOTE — ED Notes (Signed)
Positive I-stat Lactic Acids results given to Meda Coffee, RN

## 2018-07-25 NOTE — ED Provider Notes (Signed)
[facility] EMERGENCY DEPARTMENT Provider Note   CSN: [id number] Arrival date & time: 07/25/18  9528     History   Chief Complaint Chief Complaint  Patient presents with  . Dizziness  . Weakness    HPI Shelley [name] is a 62 y.o. female.  The history is provided by the patient. No language interpreter was used.  Dizziness  Quality:  Lightheadedness Severity:  Moderate Duration:  3 weeks Timing:  Intermittent Progression:  Worsening Chronicity:  New Relieved by:  Nothing Worsened by:  Nothing Ineffective treatments:  None tried Associated symptoms: weakness   Weakness  Primary symptoms include dizziness.  Pt complains of shortness of breath with coughing.  Pt reports she feels weak   Past Medical History:  Diagnosis Date  . Allergy    Environmental and Seasonal:  Pharyngeal drainage, cough and eye symptoms  . Elevated blood pressure reading without diagnosis of hypertension 2016    Patient Active Problem List   Diagnosis Date Noted  . Environmental and seasonal allergies 10/16/2015    Past Surgical History:  Procedure Laterality Date  . 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 Authorizing Provider  DM-Doxylamine-Acetaminophen (NYQUIL COLD & FLU PO) Take 22.5 mLs by mouth as needed (cold and cough).   Yes [provider]  azithromycin (ZITHROMAX) 250 MG tablet Take 1 tablet (250 mg total) by mouth daily. Take first 2 tablets together, then 1 every day until finished. Patient not taking: Reported on 07/25/2018 01/05/17   Noe Gens, PA-C  benzonatate (TESSALON) 100 MG capsule Take 1 capsule (100 mg total) by mouth every 8  (eight) hours. Patient not taking: Reported on 07/25/2018 01/05/17   Noe Gens, PA-C  ciprofloxacin (CILOXAN) 0.3 % ophthalmic solution Place 2 drops into the left eye every 4 (four) hours while awake. Administer 1 drop, every 2 hours, while awake, for 2 days. Then 1 drop, every 4 hours, while awake, for the next 5 days. Patient not taking: Reported on 07/25/2018 08/06/17   Glyn Ade, PA-C    Family History Family History  Problem Relation Age of Onset  . Heart disease Mother   . Hypertension Mother   . Congestive Heart Failure Mother        Cause of death  . Congestive Heart Failure Father   . Stroke Father        Cause of death  . Hypertension Father   . Alcohol abuse Brother   . Hypertension Brother   . Heart disease Maternal Grandmother   . Stroke Maternal Grandmother   . Diabetes Sister   . Hypertension Sister   . Heart disease Sister        Arrhythmia and myocarditis/Congenital Heart Disease/possible arrhythmia  . Diabetes Paternal Grandmother   . Diabetes Paternal Grandfather     Social History Social History   Tobacco Use  .  Smoking status: Current Some Day Smoker    Types: Cigarettes    Start date: 10/09/1990  . Smokeless tobacco: Never Used  Substance Use Topics  . Alcohol use: Yes    Alcohol/week: 0.0 standard drinks    Comment: Couple glasses of wine once to twice weekly  . Drug use: No     Allergies   Cephalexin   Review of Systems Review of Systems  Neurological: Positive for dizziness and weakness.  All other systems reviewed and are negative.    Physical Exam Updated Vital Signs BP (!) 125/52   Pulse (!) 106   Temp (!) 102.1 F (38.9 C) (Oral)   Resp 19   LMP  (LMP Unknown)   SpO2 95%   Physical Exam  Constitutional: She is oriented to person, place, and time. She appears well-developed and well-nourished.  HENT:  Head: Normocephalic.  Right Ear: External ear normal.  Eyes: Pupils are equal, round, and reactive to light.  EOM are normal.  Neck: Normal range of motion.  Cardiovascular: Normal rate and regular rhythm.  Pulmonary/Chest: Effort normal.  Abdominal: She exhibits no distension.  Musculoskeletal: Normal range of motion.  Neurological: She is alert and oriented to person, place, and time.  Skin: Skin is warm.  Psychiatric: She has a normal mood and affect.  Nursing note and vitals reviewed.    ED Treatments / Results  Labs (all labs ordered are listed, but only abnormal results are displayed) Labs Reviewed  URINALYSIS, ROUTINE W REFLEX MICROSCOPIC - Abnormal; Notable for the following components:      Result Value   APPearance CLOUDY (*)    Hgb urine dipstick SMALL (*)    Leukocytes, UA SMALL (*)    Bacteria, UA MANY (*)    All other components within normal limits  CBC WITH DIFFERENTIAL/PLATELET - Abnormal; Notable for the following components:   WBC 25.2 (*)    Neutro Abs 23.0 (*)    Monocytes Absolute 1.3 (*)    Abs Immature Granulocytes 0.17 (*)    All other components within normal limits  COMPREHENSIVE METABOLIC PANEL - Abnormal; Notable for the following components:   Glucose, Bld 107 (*)    Total Protein 8.2 (*)    Total Bilirubin 1.5 (*)    All other components within normal limits  I-STAT CG4 LACTIC ACID, ED - Abnormal; Notable for the following components:   Lactic Acid, Venous 2.01 (*)    All other components within normal limits  I-STAT CG4 LACTIC ACID, ED - Abnormal; Notable for the following components:   Lactic Acid, Venous 2.06 (*)    All other components within normal limits  CBG MONITORING, ED  I-STAT TROPONIN, ED    EKG EKG Interpretation  Date/Time:  Tuesday July 25 2018 08:43:11 EDT Ventricular Rate:  126 PR Interval:  114 QRS Duration: 80 QT Interval:  308 QTC Calculation: 446 R Axis:   41 Text Interpretation:  Sinus tachycardia Right atrial enlargement Borderline ECG Confirmed by Dene Gentry [phone]) on 07/25/2018 10:30:43  AM   Radiology Dg Chest 2 View  Result Date: 07/25/2018 CLINICAL DATA:  Cough and dizziness EXAM: CHEST - 2 VIEW COMPARISON:  None. FINDINGS: There is patchy infiltrate in the superior aspect of the right middle lobe. Lungs elsewhere are clear. Heart size and pulmonary vascularity are normal. No adenopathy. There are foci of degenerative change in the midthoracic region. IMPRESSION: Patchy infiltrate, consistent with pneumonia in the superior aspect of the right middle lobe. Lungs elsewhere clear.  No adenopathy evident. Followup PA and lateral chest radiographs recommended in 3-4 weeks following trial of antibiotic therapy to ensure resolution and exclude underlying malignancy. Electronically Signed   By: Lowella Grip III M.D.   On: 07/25/2018 09:38    Procedures Procedures (including critical care time)  Medications Ordered in ED Medications  sodium chloride 0.9 % bolus 1,000 mL (0 mLs Intravenous Stopped 07/25/18 1306)  cefTRIAXone (ROCEPHIN) 1 g in sodium chloride 0.9 % 100 mL IVPB (0 g Intravenous Stopped 07/25/18 1200)  azithromycin (ZITHROMAX) 500 mg in sodium chloride 0.9 % 250 mL IVPB (0 mg Intravenous Stopped 07/25/18 1232)     Initial Impression / Assessment and Plan / ED Course  I have reviewed the triage vital signs and the nursing notes.  Pertinent labs & imaging results that were available during my care of the patient were reviewed by me and considered in my medical decision making (see chart for details).     MDM  Pt has a temp of 102.  Chest xray shows pneumonia.  Pt placed on 02 at 2 liters.  Pt given rocephin 1 gram IV zithromax 500mg .  I spoke to Dr. Olevia Bowens [facility] call for unassigned who will admit  Final Clinical Impressions(s) / ED Diagnoses   Final diagnoses:  Community acquired pneumonia, unspecified laterality    ED Discharge Orders    None       Sidney Ace 07/25/18 1405    Valarie Merino, MD 07/25/18 [phone]

## 2018-07-25 NOTE — ED Notes (Signed)
Placed on 2L O2 [place] per Marcene Brawn, [place]

## 2018-07-26 LAB — CBC WITH DIFFERENTIAL/PLATELET
ABS IMMATURE GRANULOCYTES: 0.49 10*3/uL — AB (ref 0.00–0.07)
Basophils Absolute: 0 10*3/uL (ref 0.0–0.1)
Basophils Relative: 0 %
Eosinophils Absolute: 0.1 10*3/uL (ref 0.0–0.5)
Eosinophils Relative: 0 %
HEMATOCRIT: 39.4 % (ref 36.0–46.0)
HEMOGLOBIN: 13.1 g/dL (ref 12.0–15.0)
Immature Granulocytes: 2 %
LYMPHS ABS: 0.9 10*3/uL (ref 0.7–4.0)
Lymphocytes Relative: 3 %
MCH: 31 pg (ref 26.0–34.0)
MCHC: 33.2 g/dL (ref 30.0–36.0)
MCV: 93.4 fL (ref 80.0–100.0)
MONO ABS: 0.3 10*3/uL (ref 0.1–1.0)
MONOS PCT: 1 %
NEUTROS ABS: 27 10*3/uL — AB (ref 1.7–7.7)
NEUTROS PCT: 94 %
Platelets: 207 10*3/uL (ref 150–400)
RBC: 4.22 MIL/uL (ref 3.87–5.11)
RDW: 13.8 % (ref 11.5–15.5)
WBC MORPHOLOGY: INCREASED
WBC: 28.8 10*3/uL — ABNORMAL HIGH (ref 4.0–10.5)
nRBC: 0 % (ref 0.0–0.2)

## 2018-07-26 LAB — COMPREHENSIVE METABOLIC PANEL
ALBUMIN: 3.2 g/dL — AB (ref 3.5–5.0)
ALT: 25 U/L (ref 0–44)
ANION GAP: 8 (ref 5–15)
AST: 30 U/L (ref 15–41)
Alkaline Phosphatase: 68 U/L (ref 38–126)
BILIRUBIN TOTAL: 0.8 mg/dL (ref 0.3–1.2)
BUN: 16 mg/dL (ref 8–23)
CO2: 24 mmol/L (ref 22–32)
CREATININE: 0.97 mg/dL (ref 0.44–1.00)
Calcium: 9 mg/dL (ref 8.9–10.3)
Chloride: 108 mmol/L (ref 98–111)
GFR calc non Af Amer: >60 mL/min (ref >60)
GLUCOSE: 151 mg/dL — AB (ref 70–99)
Potassium: 4.2 mmol/L (ref 3.5–5.1)
Sodium: 140 mmol/L (ref 135–145)
TOTAL PROTEIN: 7.2 g/dL (ref 6.5–8.1)

## 2018-07-26 LAB — HIV ANTIBODY (ROUTINE TESTING W REFLEX): HIV Screen 4th Generation wRfx: NONREACTIVE

## 2018-07-26 LAB — STREP PNEUMONIAE URINARY ANTIGEN: Strep Pneumo Urinary Antigen: NEGATIVE

## 2018-07-26 MED ORDER — FOLIC ACID 1 MG PO TABS
1.0000 mg | ORAL_TABLET | Freq: Every day | ORAL | Status: DC
  Administered 2018-07-27 – 2018-07-28 (×2): 1 mg via ORAL
  Filled 2018-07-26 (×2): qty 1

## 2018-07-26 MED ORDER — VITAMIN B-1 100 MG PO TABS
100.0000 mg | ORAL_TABLET | Freq: Every day | ORAL | Status: DC
  Administered 2018-07-27 – 2018-07-28 (×2): 100 mg via ORAL
  Filled 2018-07-26 (×2): qty 1

## 2018-07-26 MED ORDER — LORAZEPAM 1 MG PO TABS: 1.0000 mg | ORAL_TABLET | Freq: Four times a day (QID) | ORAL | Status: DC | PRN

## 2018-07-26 MED ORDER — ADULT MULTIVITAMIN W/MINERALS CH
1.0000 | ORAL_TABLET | Freq: Every day | ORAL | Status: DC
  Administered 2018-07-27 – 2018-07-28 (×2): 1 via ORAL
  Filled 2018-07-26 (×2): qty 1

## 2018-07-26 MED ORDER — THIAMINE HCL 100 MG/ML IJ SOLN
100.0000 mg | Freq: Every day | INTRAMUSCULAR | Status: DC
  Filled 2018-07-26: qty 2

## 2018-07-26 MED ORDER — BENZONATATE 100 MG PO CAPS
100.0000 mg | ORAL_CAPSULE | Freq: Three times a day (TID) | ORAL | Status: DC
  Administered 2018-07-26 – 2018-07-28 (×6): 100 mg via ORAL
  Filled 2018-07-26 (×6): qty 1

## 2018-07-26 MED ORDER — LORAZEPAM 2 MG/ML IJ SOLN: 1.0000 mg | Freq: Four times a day (QID) | INTRAMUSCULAR | Status: DC | PRN

## 2018-07-26 MED ORDER — ACETAMINOPHEN 325 MG PO TABS
650.0000 mg | ORAL_TABLET | Freq: Four times a day (QID) | ORAL | Status: DC | PRN
  Administered 2018-07-26: 650 mg via ORAL
  Filled 2018-07-26: qty 2

## 2018-07-26 NOTE — Progress Notes (Signed)
[facility] Progress Note  Patient: Shelley [name]:[id number]   PCP: Mack Hook, MD DOB: Aug 11, 1956   DOA: 07/25/2018   DOS: 07/26/2018   Date of Service: the patient was seen and examined on 07/26/2018  Brief hospital course: Pt. with PMH of alcohol use, tobacco use, HTN; admitted on 07/25/2018, presented with complaint of shortness of breath, was found to have community-acquired pneumonia. Currently further plan is continue IV antibiotics.  Subjective: Feeling better, cough is better.  No nausea or vomiting no fever no chills.  No chest pain.  Assessment and Plan:  CAP (community acquired pneumonia) Severe bandemia Currently the patient looks very toxic tachypneic in the mid 30s, tachycardic in the 130s and had an episode of emesis while being examined.    Clinically getting better but still remains severely ill Continue supplemental oxygen. Continue bronchodilators as needed. Mucinex DM p.o. twice daily. Continue IV fluids. Continue ceftriaxone and azithromycin. Negative strep pneumonia urinary antigen. Check sputum Gram stain, culture and sensitivity. Follow-up blood cultures and sensitivity.  Active Problems:   Tobacco use Nicotine replacement therapy ordered. Staff to provide tobacco cessation information    Elevated bilirubin Likely due to EtOH abuse. Trend level daily.    Alcohol use CiWA protocol started.  Diet: Regular diet DVT Prophylaxis: subcutaneous Heparin  Advance goals of care discussion: full code  Family Communication: no family was present at bedside, at the time of interview.   Disposition:  Discharge to home.  Consultants: none Procedures: none  Scheduled Meds: . benzonatate  100 mg Oral TID  . dextromethorphan-guaiFENesin  1 tablet Oral BID  . folic acid  1 mg Oral Daily  . heparin  5,000 Units Subcutaneous Q8H  . multivitamin with minerals  1 tablet Oral Daily  . thiamine  100 mg Oral Daily   Or  . thiamine  100  mg Intravenous Daily   Continuous Infusions: . azithromycin 500 mg (07/26/18 1248)  . cefTRIAXone (ROCEPHIN)  IV 1 g (07/26/18 1247)   PRN Meds: acetaminophen, ipratropium-albuterol, LORazepam **OR** LORazepam, ondansetron **OR** ondansetron (ZOFRAN) IV Antibiotics: Anti-infectives (From admission, onward)   Start     Dose/Rate Route Frequency Ordered Stop   07/26/18 1200  cefTRIAXone (ROCEPHIN) 1 g in sodium chloride 0.9 % 100 mL IVPB     1 g 200 mL/hr over 30 Minutes Intravenous Every 24 hours 07/25/18 1410 08/02/18 1159   07/26/18 1200  azithromycin (ZITHROMAX) 500 mg in sodium chloride 0.9 % 250 mL IVPB     500 mg 250 mL/hr over 60 Minutes Intravenous Every 24 hours 07/25/18 1410 08/02/18 1159   07/25/18 1000  cefTRIAXone (ROCEPHIN) 1 g in sodium chloride 0.9 % 100 mL IVPB     1 g 200 mL/hr over 30 Minutes Intravenous  Once 07/25/18 0948 07/25/18 1200   07/25/18 1000  azithromycin (ZITHROMAX) 500 mg in sodium chloride 0.9 % 250 mL IVPB     500 mg 250 mL/hr over 60 Minutes Intravenous  Once 07/25/18 0948 07/25/18 1232       Objective: Physical Exam: Vitals:   07/26/18 0450 07/26/18 0731 07/26/18 1125 07/26/18 1636  BP: (!) 144/91 (!) 169/81 (!) 159/87 (!) 150/95  Pulse: 78 71 67 77  Resp: 19 17 19 17   Temp: 98.5 F (36.9 C) 98.3 F (36.8 C) 98 F (36.7 C) 98.2 F (36.8 C)  TempSrc: Oral Oral Oral Oral  SpO2: 99% 100% 99% 98%  Weight:      Height:  Intake/Output Summary (Last 24 hours) at 07/26/2018 1932 Last data filed at 07/26/2018 1500 Gross per 24 hour  Intake 590 ml  Output -  Net 590 ml   Filed Weights   07/25/18 1818  Weight: 60.8 kg   General: Alert, Awake and Oriented to Time, Place and Person. Appear in moderate distress, affect appropriate Eyes: PERRL, Conjunctiva normal ENT: Oral Mucosa clear moist. Neck: no JVD, no Abnormal Mass Or lumps Cardiovascular: S1 and S2 Present, no Murmur, Peripheral Pulses Present Respiratory: normal  respiratory effort, Bilateral Air entry equal and Decreased, no use of accessory muscle, bilateral  Crackles, no wheezes Abdomen: Bowel Sound present, Soft and no tenderness, no hernia Skin: no redness, no Rash, no induration Extremities: no Pedal edema, no calf tenderness Neurologic: Grossly no focal neuro deficit. Bilaterally Equal motor strength  Data Reviewed: CBC: Recent Labs  Lab 07/25/18 0856 07/26/18 0246  WBC 25.2* 28.8*  NEUTROABS 23.0* 27.0*  HGB 14.9 13.1  HCT 45.1 39.4  MCV 93.2 93.4  PLT 246 790   Basic Metabolic Panel: Recent Labs  Lab 07/25/18 0856 07/26/18 0246  NA 138 140  K 4.1 4.2  CL 102 108  CO2 24 24  GLUCOSE 107* 151*  BUN 10 16  CREATININE 0.91 0.97  CALCIUM 9.7 9.0  MG 1.8  --   PHOS 3.3  --     Liver Function Tests: Recent Labs  Lab 07/25/18 0856 07/26/18 0246  AST 21 30  ALT 16 25  ALKPHOS 90 68  BILITOT 1.5* 0.8  PROT 8.2* 7.2  ALBUMIN 4.2 3.2*   No results for input(s): LIPASE, AMYLASE in the last 168 hours. No results for input(s): AMMONIA in the last 168 hours. Coagulation Profile: Recent Labs  Lab 07/25/18 1544  INR 1.26   Cardiac Enzymes: No results for input(s): CKTOTAL, CKMB, CKMBINDEX, TROPONINI in the last 168 hours. BNP (last 3 results) No results for input(s): PROBNP in the last 8760 hours. CBG: No results for input(s): GLUCAP in the last 168 hours. Studies: No results found.   Time spent: 35 minutes  Author: Berle Mull, MD [facility] Pager: [phone] 07/26/2018 7:32 PM  Between 7PM-7AM, please contact night-coverage at [email or web address], password [facility]

## 2018-07-27 LAB — CBC WITH DIFFERENTIAL/PLATELET
ABS IMMATURE GRANULOCYTES: 0.15 10*3/uL — AB (ref 0.00–0.07)
BASOS ABS: 0 10*3/uL (ref 0.0–0.1)
BASOS PCT: 0 %
EOS ABS: 0 10*3/uL (ref 0.0–0.5)
Eosinophils Relative: 0 %
HEMATOCRIT: 37.6 % (ref 36.0–46.0)
Hemoglobin: 12.3 g/dL (ref 12.0–15.0)
IMMATURE GRANULOCYTES: 1 %
LYMPHS ABS: 2.4 10*3/uL (ref 0.7–4.0)
Lymphocytes Relative: 11 %
MCH: 30.6 pg (ref 26.0–34.0)
MCHC: 32.7 g/dL (ref 30.0–36.0)
MCV: 93.5 fL (ref 80.0–100.0)
Monocytes Absolute: 0.9 10*3/uL (ref 0.1–1.0)
Monocytes Relative: 4 %
NEUTROS ABS: 17.8 10*3/uL — AB (ref 1.7–7.7)
NEUTROS PCT: 84 %
NRBC: 0 % (ref 0.0–0.2)
PLATELETS: 195 10*3/uL (ref 150–400)
RBC: 4.02 MIL/uL (ref 3.87–5.11)
RDW: 13.4 % (ref 11.5–15.5)
WBC: 21.3 10*3/uL — ABNORMAL HIGH (ref 4.0–10.5)

## 2018-07-27 LAB — COMPREHENSIVE METABOLIC PANEL
ALBUMIN: 3 g/dL — AB (ref 3.5–5.0)
ALT: 22 U/L (ref 0–44)
AST: 16 U/L (ref 15–41)
Alkaline Phosphatase: 61 U/L (ref 38–126)
Anion gap: 8 (ref 5–15)
BUN: 12 mg/dL (ref 8–23)
CO2: 26 mmol/L (ref 22–32)
CREATININE: 0.83 mg/dL (ref 0.44–1.00)
Calcium: 8.7 mg/dL — ABNORMAL LOW (ref 8.9–10.3)
Chloride: 105 mmol/L (ref 98–111)
GFR calc Af Amer: >60 mL/min (ref >60)
GFR calc non Af Amer: >60 mL/min (ref >60)
GLUCOSE: 104 mg/dL — AB (ref 70–99)
Potassium: 3.6 mmol/L (ref 3.5–5.1)
Sodium: 139 mmol/L (ref 135–145)
Total Bilirubin: 0.7 mg/dL (ref 0.3–1.2)
Total Protein: 6.4 g/dL — ABNORMAL LOW (ref 6.5–8.1)

## 2018-07-27 MED ORDER — ENOXAPARIN SODIUM 40 MG/0.4ML ~~LOC~~ SOLN: 40.0000 mg | SUBCUTANEOUS | Status: DC

## 2018-07-27 MED ORDER — AMOXICILLIN-POT CLAVULANATE 875-125 MG PO TABS: 1.0000 | ORAL_TABLET | Freq: Two times a day (BID) | ORAL | Status: DC

## 2018-07-27 MED ORDER — AZITHROMYCIN 250 MG PO TABS
500.0000 mg | ORAL_TABLET | Freq: Every day | ORAL | Status: DC
  Administered 2018-07-27 – 2018-07-28 (×2): 500 mg via ORAL
  Filled 2018-07-27 (×2): qty 2

## 2018-07-27 MED ORDER — PANTOPRAZOLE SODIUM 40 MG PO TBEC
40.0000 mg | DELAYED_RELEASE_TABLET | Freq: Every day | ORAL | Status: DC
  Administered 2018-07-27 – 2018-07-28 (×2): 40 mg via ORAL
  Filled 2018-07-27 (×2): qty 1

## 2018-07-27 MED ORDER — AMOXICILLIN-POT CLAVULANATE 875-125 MG PO TABS
1.0000 | ORAL_TABLET | Freq: Two times a day (BID) | ORAL | Status: DC
  Administered 2018-07-27 – 2018-07-28 (×3): 1 via ORAL
  Filled 2018-07-27 (×3): qty 1

## 2018-07-27 NOTE — Care Management Note (Signed)
Case Management Note  Patient Details  Name: Shelley [name] MRN: [id number] Date of Birth: 05-31-1956  Subjective/Objective:   From home alone, presents with CAP, has etoh use, awating cx's, for one more day of iv abx, then plan dc tomorrow.  She states she has no PCP, NCM scheduled a hospital follow up with [facility], with Dr. London Pepper at 11/1 1:30 am.                   Action/Plan: DC home when ready.  Expected Discharge Date:                  Expected Discharge Plan:     In-House Referral:     Discharge planning Services  CM Consult  Post Acute Care Choice:    Choice offered to:     DME Arranged:    DME Agency:     HH Arranged:    HH Agency:     Status of Service:  In process, will continue to follow  If discussed at Long Length of Stay Meetings, dates discussed:    Additional Comments:  Zenon Mayo, RN 07/27/2018, 4:08 PM

## 2018-07-27 NOTE — Progress Notes (Signed)
[facility] Progress Note  Patient: Shelley [name] JKD:[id number]   PCP: Mack Hook, MD DOB: 05/29/1956   DOA: 07/25/2018   DOS: 07/27/2018   Date of Service: the patient was seen and examined on 07/27/2018  Brief hospital course: Pt. with PMH of alcohol use, tobacco use, HTN; admitted on 07/25/2018, presented with complaint of shortness of breath, was found to have community-acquired pneumonia. Currently further plan is continue antibiotics.  Subjective: Continues to have dry cough.  No fever no chills.  No nausea no vomiting.  Assessment and Plan: Sepsis secondary to CAP (community acquired pneumonia) Severe bandemia On Admission the patient looked very toxic tachypneic in the mid 30s, tachycardic in the 130s and had an episode of emesis while being examined. Clinically getting better On room air Continue bronchodilators as needed. Mucinex DM p.o. twice daily. Treated with IV ceftriaxone and azithromycin. No blood culture done on admission. Clinically better so we can change to ORAL Antibiotics. Negative strep pneumonia urinary antigen.  Tobacco use Nicotine replacement therapy ordered. Staff to provide tobacco cessation information  Elevated bilirubin Likely due to EtOH abuse. Trend level daily.  Alcohol use CiWAprotocol started.  Diet: regular diet DVT Prophylaxis: subcutaneous Heparin  Advance goals of care discussion: full code  Family Communication: no family was present at bedside, at the time of interview.   Disposition:  Discharge to home tomorrow.  Consultants: none Procedures: none  Scheduled Meds: . amoxicillin-clavulanate  1 tablet Oral Q12H  . azithromycin  500 mg Oral Daily  . benzonatate  100 mg Oral TID  . dextromethorphan-guaiFENesin  1 tablet Oral BID  . enoxaparin (LOVENOX) injection  40 mg Subcutaneous Q24H  . folic acid  1 mg Oral Daily  . multivitamin with minerals  1 tablet Oral Daily  . pantoprazole  40 mg Oral  Daily  . thiamine  100 mg Oral Daily   Or  . thiamine  100 mg Intravenous Daily   Continuous Infusions: PRN Meds: acetaminophen, ipratropium-albuterol, LORazepam **OR** LORazepam, ondansetron **OR** ondansetron (ZOFRAN) IV Antibiotics: Anti-infectives (From admission, onward)   Start     Dose/Rate Route Frequency Ordered Stop   07/28/18 1000  amoxicillin-clavulanate (AUGMENTIN) 875-125 MG per tablet 1 tablet  Status:  Discontinued     1 tablet Oral Every 12 hours 07/27/18 1132 07/27/18 1137   07/27/18 1200  azithromycin (ZITHROMAX) tablet 500 mg     500 mg Oral Daily 07/27/18 1137     07/27/18 1200  amoxicillin-clavulanate (AUGMENTIN) 875-125 MG per tablet 1 tablet     1 tablet Oral Every 12 hours 07/27/18 1137     07/26/18 1200  cefTRIAXone (ROCEPHIN) 1 g in sodium chloride 0.9 % 100 mL IVPB  Status:  Discontinued     1 g 200 mL/hr over 30 Minutes Intravenous Every 24 hours 07/25/18 1410 07/27/18 1137   07/26/18 1200  azithromycin (ZITHROMAX) 500 mg in sodium chloride 0.9 % 250 mL IVPB  Status:  Discontinued     500 mg 250 mL/hr over 60 Minutes Intravenous Every 24 hours 07/25/18 1410 07/27/18 1137   07/25/18 1000  cefTRIAXone (ROCEPHIN) 1 g in sodium chloride 0.9 % 100 mL IVPB     1 g 200 mL/hr over 30 Minutes Intravenous  Once 07/25/18 0948 07/25/18 1200   07/25/18 1000  azithromycin (ZITHROMAX) 500 mg in sodium chloride 0.9 % 250 mL IVPB     500 mg 250 mL/hr over 60 Minutes Intravenous  Once 07/25/18 0948 07/25/18 1232  Objective: Physical Exam: Vitals:   07/26/18 1125 07/26/18 1636 07/26/18 2215 07/27/18 0720  BP: (!) 159/87 (!) 150/95 (!) 159/85 139/63  Pulse: 67 77 73 69  Resp: 19 17 17    Temp: 98 F (36.7 C) 98.2 F (36.8 C) 98.5 F (36.9 C) 98.1 F (36.7 C)  TempSrc: Oral Oral Oral Oral  SpO2: 99% 98% 98% 94%  Weight:      Height:       No intake or output data in the 24 hours ending 07/27/18 1724 Filed Weights   07/25/18 1818  Weight: 60.8 kg    General: Alert, Awake and Oriented to Time, Place and Person. Appear in no distress, affect appropriate Eyes: PERRL, Conjunctiva normal ENT: Oral Mucosa clear moist. Neck: no JVD, no Abnormal Mass Or lumps Cardiovascular: S1 and S2 Present, no Murmur, Peripheral Pulses Present Respiratory: normal respiratory effort, Bilateral Air entry equal and Decreased, no use of accessory muscle, bilateral Crackles, no wheezes Abdomen: Bowel Sound present, Soft and no tenderness, no hernia Skin: no redness, no Rash, no induration Extremities: no Pedal edema, no calf tenderness Neurologic: Grossly no focal neuro deficit. Bilaterally Equal motor strength  Data Reviewed: CBC: Recent Labs  Lab 07/25/18 0856 07/26/18 0246 07/27/18 0221  WBC 25.2* 28.8* 21.3*  NEUTROABS 23.0* 27.0* 17.8*  HGB 14.9 13.1 12.3  HCT 45.1 39.4 37.6  MCV 93.2 93.4 93.5  PLT 246 207 160   Basic Metabolic Panel: Recent Labs  Lab 07/25/18 0856 07/26/18 0246 07/27/18 0221  NA 138 140 139  K 4.1 4.2 3.6  CL 102 108 105  CO2 24 24 26   GLUCOSE 107* 151* 104*  BUN 10 16 12   CREATININE 0.91 0.97 0.83  CALCIUM 9.7 9.0 8.7*  MG 1.8  --   --   PHOS 3.3  --   --     Liver Function Tests: Recent Labs  Lab 07/25/18 0856 07/26/18 0246 07/27/18 0221  AST 21 30 16   ALT 16 25 22   ALKPHOS 90 68 61  BILITOT 1.5* 0.8 0.7  PROT 8.2* 7.2 6.4*  ALBUMIN 4.2 3.2* 3.0*   No results for input(s): LIPASE, AMYLASE in the last 168 hours. No results for input(s): AMMONIA in the last 168 hours. Coagulation Profile: Recent Labs  Lab 07/25/18 1544  INR 1.26   Cardiac Enzymes: No results for input(s): CKTOTAL, CKMB, CKMBINDEX, TROPONINI in the last 168 hours. BNP (last 3 results) No results for input(s): PROBNP in the last 8760 hours. CBG: No results for input(s): GLUCAP in the last 168 hours. Studies: No results found.   Time spent: 35 minutes  Author: Berle Mull, MD [facility] Pager:  [phone] 07/27/2018 5:24 PM  Between 7PM-7AM, please contact night-coverage at [email or web address], password [facility]

## 2018-07-28 LAB — CBC WITH DIFFERENTIAL/PLATELET
Abs Immature Granulocytes: 0.04 10*3/uL (ref 0.00–0.07)
BASOS PCT: 0 %
Basophils Absolute: 0 10*3/uL (ref 0.0–0.1)
EOS ABS: 0 10*3/uL (ref 0.0–0.5)
Eosinophils Relative: 0 %
HCT: 38.4 % (ref 36.0–46.0)
Hemoglobin: 12.8 g/dL (ref 12.0–15.0)
Immature Granulocytes: 1 %
Lymphocytes Relative: 28 %
Lymphs Abs: 2.5 10*3/uL (ref 0.7–4.0)
MCH: 31.3 pg (ref 26.0–34.0)
MCHC: 33.3 g/dL (ref 30.0–36.0)
MCV: 93.9 fL (ref 80.0–100.0)
MONOS PCT: 8 %
Monocytes Absolute: 0.7 10*3/uL (ref 0.1–1.0)
NEUTROS ABS: 5.5 10*3/uL (ref 1.7–7.7)
NEUTROS PCT: 63 %
PLATELETS: 199 10*3/uL (ref 150–400)
RBC: 4.09 MIL/uL (ref 3.87–5.11)
RDW: 13.1 % (ref 11.5–15.5)
WBC: 8.7 10*3/uL (ref 4.0–10.5)
nRBC: 0 % (ref 0.0–0.2)

## 2018-07-28 MED ORDER — AZITHROMYCIN 500 MG PO TABS: 500.0000 mg | ORAL_TABLET | Freq: Every day | ORAL | 0 refills | Status: AC

## 2018-07-28 MED ORDER — BENZONATATE 100 MG PO CAPS: 100.0000 mg | ORAL_CAPSULE | Freq: Three times a day (TID) | ORAL | 0 refills | Status: DC

## 2018-07-28 MED ORDER — PANTOPRAZOLE SODIUM 40 MG PO TBEC: 40.0000 mg | DELAYED_RELEASE_TABLET | Freq: Every day | ORAL | 0 refills | Status: DC

## 2018-07-28 MED ORDER — THIAMINE HCL 100 MG PO TABS: 100.0000 mg | ORAL_TABLET | Freq: Every day | ORAL | 0 refills | Status: DC

## 2018-07-28 MED ORDER — FOLIC ACID 1 MG PO TABS: 1.0000 mg | ORAL_TABLET | Freq: Every day | ORAL | 0 refills | Status: DC

## 2018-07-28 MED ORDER — AMOXICILLIN-POT CLAVULANATE 875-125 MG PO TABS: 1.0000 | ORAL_TABLET | Freq: Two times a day (BID) | ORAL | 0 refills | Status: AC

## 2018-07-28 MED ORDER — DM-GUAIFENESIN ER 30-600 MG PO TB12: 1.0000 | ORAL_TABLET | Freq: Two times a day (BID) | ORAL | 0 refills | Status: AC

## 2018-07-28 MED FILL — THIAMINE HCL 100 MG TABS: 100 | 30 days supply | Qty: 30 | Fill #0

## 2018-07-28 MED FILL — AMOX-CLAV 875-125 MG TABLET: 875-125 | 3 days supply | Qty: 6 | Fill #0

## 2018-07-28 MED FILL — AZITHROMYCIN 500 MG TABLET: 500 | 3 days supply | Qty: 3 | Fill #0

## 2018-07-28 MED FILL — FOLIC ACID 1 MG TABS: 1 | 30 days supply | Qty: 30 | Fill #0

## 2018-07-28 MED FILL — MUCUS RELIEF DM 30-600 MG T: 30-600 | 6 days supply | Qty: 12 | Fill #0

## 2018-07-28 MED FILL — PANTOPRAZOLE SOD DR 40 MG T: 40 | 14 days supply | Qty: 14 | Fill #0

## 2018-07-28 MED FILL — BENZONATATE 100 MG CAP: 100 | 7 days supply | Qty: 20 | Fill #0

## 2018-07-29 NOTE — Discharge Summary (Signed)
[facility] Discharge Summary   Patient: Shelley [name]:[id number]   PCP: Mack Hook, MD DOB: 1956/05/11   Date of admission: 07/25/2018   Date of discharge: 07/28/2018    Discharge Diagnoses:  Principal diagnosis Sepsis secondary to community-acquired pneumonia Principal Problem:   CAP (community acquired pneumonia) Active Problems:   Tobacco use   Elevated bilirubin   Alcohol use   Admitted From: Home Disposition: Home  Recommendations for Outpatient Follow-up:  1. Please follow-up with PCP in 1 week.  Follow-up Information    London Pepper, MD Follow up on 08/04/2018.   Specialty:  Family Medicine Why:  11:30 please arrive 15 minutes early , bring insurance card and ID, paper work will be mailed to you, bring this with you also.  Contact information: [place] [address] [phone]        Mack Hook, MD. Schedule an appointment as soon as possible for a visit in 1 week(s).   Specialty:  Internal Medicine Contact information: [place] [address] [phone]          Diet recommendation: Regular diet  Activity: The patient is advised to gradually reintroduce usual activities.  Discharge Condition: good  Code Status: Full code  History of present illness: As per the H and P dictated on admission, " Shelley [name] is a 62 y.o. female with medical history significant of allergies, tobacco use, elevated blood pressure without a formal diagnosis of hypertension who is coming to the emergency department with complaints of progressively worse dyspnea for the past 3 weeks.  She states that about 3 weeks ago she had sore throat for 1 day associated with a dry cough.  She states that she "was unable to get over the cold" and develop a productive cough of yellowish sputum.  She continues to smoke daily and has had wheezing.  She has been having fever, chills with malaise since this morning.   She has pleuritic chest pain.  She has been feeling dizzy since yesterday.  She denies hemoptysis, PND, or pitting edema of the lower extremities.  She denies abdominal pain, but had one episode of emesis while being interviewed and examined.  No diarrhea or constipation.  She has had mild dysuria, but denies frequency or hematuria.  No polyuria, polydipsia, polyphagia or blurred vision."  Hospital Course:  Summary of her active problems in the hospital is as following. Sepsis secondary to CAP (community acquired pneumonia) Severe bandemia On Admission the patient looked very toxic tachypneic in the mid 30s, tachycardic in the 130s and had an episode of emesis while being examined. Clinically getting better On room air Continue bronchodilators as needed. Mucinex DM p.o. twice daily. Treated with IV ceftriaxone and azithromycin. No blood culture done on admission. Clinically better so we change to ORAL Antibiotics. Negativestrep pneumonia urinary antigen.  Tobacco use Nicotine replacement therapy ordered. Staff to provide tobacco cessation information  Alcohol use CiWAprotocol was used.  No withdrawal here in the hospital.  All other chronic medical condition were stable during the hospitalization.  Patient was ambulatory without any assistance. On the day of the discharge the patient's vitals were stable , and no other acute medical condition were reported by patient. the patient was felt safe to be discharge at home with family.  Consultants: none Procedures: none  DISCHARGE MEDICATION: Allergies as of 07/28/2018      Reactions   Cephalexin Diarrhea   Developed diarrhea after only 2 doses.  Medication List    STOP taking these medications   ciprofloxacin 0.3 % ophthalmic solution Commonly known as:  CILOXAN   NYQUIL COLD & FLU PO     TAKE these medications   amoxicillin-clavulanate 875-125 MG tablet Commonly known as:  AUGMENTIN Take 1 tablet by mouth every  12 (twelve) hours for 3 days.   azithromycin 500 MG tablet Commonly known as:  ZITHROMAX Take 1 tablet (500 mg total) by mouth daily for 3 days. What changed:    medication strength  how much to take  additional instructions   benzonatate 100 MG capsule Commonly known as:  TESSALON Take 1 capsule (100 mg total) by mouth 3 (three) times daily. What changed:  when to take this   dextromethorphan-guaiFENesin 30-600 MG 12hr tablet Commonly known as:  MUCINEX DM Take 1 tablet by mouth 2 (two) times daily for 6 days.   folic acid 1 MG tablet Commonly known as:  FOLVITE Take 1 tablet (1 mg total) by mouth daily.   pantoprazole 40 MG tablet Commonly known as:  PROTONIX Take 1 tablet (40 mg total) by mouth daily for 14 days.   thiamine 100 MG tablet Take 1 tablet (100 mg total) by mouth daily.      Allergies  Allergen Reactions  . Cephalexin Diarrhea    Developed diarrhea after only 2 doses.   Discharge Instructions    Diet - low sodium heart healthy   Complete by:  As directed    Discharge instructions   Complete by:  As directed    It is important that you read following instructions as well as go over your medication list with RN to help you understand your care after this hospitalization.  Discharge Instructions: Please follow-up with PCP in one week  Please request your primary care physician to go over all Hospital Tests and Procedure/Radiological results at the follow up,  Please get all Hospital records sent to your PCP by signing hospital release before you go home.    Do not take more than prescribed Pain, Sleep and Anxiety Medications. You were cared for by a hospitalist during your hospital stay. If you have any questions about your discharge medications or the care you received while you were in the hospital after you are discharged, you can call the unit you were admitted to and ask to speak with the hospitalist on call if the hospitalist that took care of  you is not available.  Once you are discharged, your primary care physician will handle any further medical issues. Please note that NO REFILLS for any discharge medications will be authorized once you are discharged, as it is imperative that you return to your primary care physician (or establish a relationship with a primary care physician if you do not have one) for your aftercare needs so that they can reassess your need for medications and monitor your lab values. You Must read complete instructions/literature along with all the possible adverse reactions/side effects for all the Medicines you take and that have been prescribed to you. Take any new Medicines after you have completely understood and accept all the possible adverse reactions/side effects. Wear Seat belts while driving. If you have smoked or chewed Tobacco in the last 2 yrs please stop smoking and/or stop any Recreational drug use.   Increase activity slowly   Complete by:  As directed      Discharge Exam: Filed Weights   07/25/18 1818  Weight: 60.8 kg   Vitals:  07/27/18 2334 07/28/18 0720  BP: (!) 144/74 (!) 158/76  Pulse: 64 61  Resp: 18 13  Temp: 98.5 F (36.9 C) 98.1 F (36.7 C)  SpO2: 97% 99%   General: Appear in no distress, no Rash; Oral Mucosa moist. Cardiovascular: S1 and S2 Present, no Murmur, no JVD Respiratory: Bilateral Air entry present and bilateral  Crackles, no wheezes Abdomen: Bowel Sound present, Soft and no tenderness Extremities: no Pedal edema, on calf tenderness Neurology: Grossly no focal neuro deficit.  The results of significant diagnostics from this hospitalization (including imaging, microbiology, ancillary and laboratory) are listed below for reference.    Significant Diagnostic Studies: Dg Chest 2 View  Result Date: 07/25/2018 CLINICAL DATA:  Cough and dizziness EXAM: CHEST - 2 VIEW COMPARISON:  None. FINDINGS: There is patchy infiltrate in the superior aspect of the right  middle lobe. Lungs elsewhere are clear. Heart size and pulmonary vascularity are normal. No adenopathy. There are foci of degenerative change in the midthoracic region. IMPRESSION: Patchy infiltrate, consistent with pneumonia in the superior aspect of the right middle lobe. Lungs elsewhere clear. No adenopathy evident. Followup PA and lateral chest radiographs recommended in 3-4 weeks following trial of antibiotic therapy to ensure resolution and exclude underlying malignancy. Electronically Signed   By: Lowella Grip III M.D.   On: 07/25/2018 09:38    Microbiology: Recent Results (from the past 240 hour(s))  MRSA PCR Screening     Status: None   Collection Time: 07/25/18  6:14 PM  Result Value Ref Range Status   MRSA by PCR NEGATIVE NEGATIVE Final    Comment:        The GeneXpert MRSA Assay (FDA approved for NASAL specimens only), is one component of a comprehensive MRSA colonization surveillance program. It is not intended to diagnose MRSA infection nor to guide or monitor treatment for MRSA infections. Performed at [facility], [place] [address], [place], [place] [address]      Labs: CBC: Recent Labs  Lab 07/25/18 0856 07/26/18 0246 07/27/18 0221 07/28/18 0310  WBC 25.2* 28.8* 21.3* 8.7  NEUTROABS 23.0* 27.0* 17.8* 5.5  HGB 14.9 13.1 12.3 12.8  HCT 45.1 39.4 37.6 38.4  MCV 93.2 93.4 93.5 93.9  PLT 246 207 195 683   Basic Metabolic Panel: Recent Labs  Lab 07/25/18 0856 07/26/18 0246 07/27/18 0221  NA 138 140 139  K 4.1 4.2 3.6  CL 102 108 105  CO2 24 24 26   GLUCOSE 107* 151* 104*  BUN 10 16 12   CREATININE 0.91 0.97 0.83  CALCIUM 9.7 9.0 8.7*  MG 1.8  --   --   PHOS 3.3  --   --    Liver Function Tests: Recent Labs  Lab 07/25/18 0856 07/26/18 0246 07/27/18 0221  AST 21 30 16   ALT 16 25 22   ALKPHOS 90 68 61  BILITOT 1.5* 0.8 0.7  PROT 8.2* 7.2 6.4*  ALBUMIN 4.2 3.2* 3.0*   No results for input(s): LIPASE, AMYLASE in the last 168 hours. No  results for input(s): AMMONIA in the last 168 hours. Cardiac Enzymes: No results for input(s): CKTOTAL, CKMB, CKMBINDEX, TROPONINI in the last 168 hours. BNP (last 3 results) No results for input(s): BNP in the last 8760 hours. CBG: No results for input(s): GLUCAP in the last 168 hours. Time spent: 35 minutes  Signed:  Berle Mull  [facility] 07/28/2018 , 4:31 PM

## 2018-10-22 ENCOUNTER — Other Ambulatory Visit: Payer: Self-pay

## 2018-10-22 ENCOUNTER — Emergency Department (HOSPITAL_COMMUNITY): Payer: BLUE CROSS/BLUE SHIELD

## 2018-10-22 ENCOUNTER — Encounter (HOSPITAL_COMMUNITY): Payer: Self-pay | Admitting: *Deleted

## 2018-10-22 ENCOUNTER — Emergency Department (HOSPITAL_COMMUNITY)
Admission: EM | Admit: 2018-10-22 | Discharge: 2018-10-22 | Disposition: A | Discharge status: Home / Self Care | Payer: BLUE CROSS/BLUE SHIELD | Attending: Emergency Medicine | Admitting: Emergency Medicine

## 2018-10-22 DIAGNOSIS — F17210 Nicotine dependence, cigarettes, uncomplicated: Secondary | ICD-10-CM | POA: Insufficient documentation

## 2018-10-22 DIAGNOSIS — K529 Noninfective gastroenteritis and colitis, unspecified: Secondary | ICD-10-CM | POA: Insufficient documentation

## 2018-10-22 DIAGNOSIS — Z79899 Other long term (current) drug therapy: Secondary | ICD-10-CM | POA: Insufficient documentation

## 2018-10-22 DIAGNOSIS — R109 Unspecified abdominal pain: Secondary | ICD-10-CM | POA: Diagnosis present

## 2018-10-22 LAB — COMPREHENSIVE METABOLIC PANEL
ALK PHOS: 74 U/L (ref 38–126)
ALT: 15 U/L (ref 0–44)
AST: 17 U/L (ref 15–41)
Albumin: 3.5 g/dL (ref 3.5–5.0)
Anion gap: 11 (ref 5–15)
BUN: 8 mg/dL (ref 8–23)
CALCIUM: 8.9 mg/dL (ref 8.9–10.3)
CHLORIDE: 104 mmol/L (ref 98–111)
CO2: 23 mmol/L (ref 22–32)
Creatinine, Ser: 0.73 mg/dL (ref 0.44–1.00)
GFR calc Af Amer: >60 mL/min (ref >60)
Glucose, Bld: 107 mg/dL — ABNORMAL HIGH (ref 70–99)
Potassium: 3.8 mmol/L (ref 3.5–5.1)
Sodium: 138 mmol/L (ref 135–145)
Total Bilirubin: 0.6 mg/dL (ref 0.3–1.2)
Total Protein: 6.7 g/dL (ref 6.5–8.1)

## 2018-10-22 LAB — LIPASE, BLOOD: LIPASE: 22 U/L (ref 11–51)

## 2018-10-22 LAB — CBC
HEMATOCRIT: 44.8 % (ref 36.0–46.0)
Hemoglobin: 15.4 g/dL — ABNORMAL HIGH (ref 12.0–15.0)
MCH: 32.7 pg (ref 26.0–34.0)
MCHC: 34.4 g/dL (ref 30.0–36.0)
MCV: 95.1 fL (ref 80.0–100.0)
PLATELETS: 244 10*3/uL (ref 150–400)
RBC: 4.71 MIL/uL (ref 3.87–5.11)
RDW: 12.9 % (ref 11.5–15.5)
WBC: 7.9 10*3/uL (ref 4.0–10.5)
nRBC: 0 % (ref 0.0–0.2)

## 2018-10-22 MED ORDER — ACETAMINOPHEN ER 650 MG PO TBCR: 650.0000 mg | EXTENDED_RELEASE_TABLET | Freq: Three times a day (TID) | ORAL | 0 refills | Status: DC | PRN

## 2018-10-22 MED ORDER — LOPERAMIDE HCL 2 MG PO CAPS: 4.0000 mg | ORAL_CAPSULE | Freq: Every evening | ORAL | 0 refills | Status: DC | PRN

## 2018-10-22 MED ORDER — ONDANSETRON 8 MG PO TBDP: 8.0000 mg | ORAL_TABLET | Freq: Three times a day (TID) | ORAL | 0 refills | Status: DC | PRN

## 2018-10-22 MED ORDER — SODIUM CHLORIDE 0.9% FLUSH: 3.0000 mL | Freq: Once | INTRAVENOUS | Status: DC

## 2018-10-22 MED ORDER — ACETAMINOPHEN 325 MG PO TABS
650.0000 mg | ORAL_TABLET | Freq: Once | ORAL | Status: AC
  Administered 2018-10-22: 325 mg via ORAL
  Filled 2018-10-22: qty 2

## 2018-10-22 MED ORDER — IOHEXOL 300 MG/ML  SOLN
100.0000 mL | Freq: Once | INTRAMUSCULAR | Status: AC | PRN
  Administered 2018-10-22: 100 mL via INTRAVENOUS

## 2018-10-22 NOTE — ED Notes (Signed)
Patient verbalizes understanding of discharge instructions. Opportunity for questioning and answers were provided. Armband removed by staff, pt discharged from ED in wheelchair.  

## 2018-10-22 NOTE — ED Provider Notes (Signed)
[facility] EMER[facility]ENC[facility] DEPARTMENT Provider Note   CSN: [id number] Arrival date & time: 10/22/18  1238     History   Chief Complaint Chief Complaint  Patient presents with  . Abscess  . Abdominal Pain    HPI Shelley [name] is a 63 y.o. female.  HPI  63 year old female comes in with chief complaint of abdominal pain. Patient reports that she started having abdominal pain 3 days ago.  Initially she had constipation, but now she is having loose bowel movements.  Her pain is mostly in the upper quadrants.  Patient has history of heavy alcohol use history, but she denies any history of diabetes, CAD or stroke.  She denies any history of abdominal surgeries.  Past Medical History:  Diagnosis Date  . Allergy    Environmental and Seasonal:  Pharyngeal drainage, cough and eye symptoms  . Elevated blood pressure reading without diagnosis of hypertension 2016  . Tobacco use     Patient Active Problem List   Diagnosis Date Noted  . CAP (community acquired pneumonia) 07/25/2018  . Tobacco use 07/25/2018  . Elevated bilirubin 07/25/2018  . Alcohol use 07/25/2018  . Environmental and seasonal allergies 10/16/2015    Past Surgical History:  Procedure Laterality Date  . 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 Authorizing Provider  acetaminophen (TYLENOL 8 HOUR) 650 MG CR tablet Take 1 tablet (650 mg total) by mouth every 8 (eight) hours as needed for pain or fever. 10/22/18   Varney Biles, MD  benzonatate (TESSALON) 100 MG capsule Take 1 capsule (100 mg total) by mouth 3 (three) times daily. 07/28/18   Lavina Hamman, MD  folic acid (FOLVITE) 1 MG tablet  Take 1 tablet (1 mg total) by mouth daily. 07/28/18   Lavina Hamman, MD  loperamide (IMODIUM) 2 MG capsule Take 2 capsules (4 mg total) by mouth at bedtime as needed for diarrhea or loose stools. 10/22/18   Varney Biles, MD  ondansetron (ZOFRAN ODT) 8 MG disintegrating tablet Take 1 tablet (8 mg total) by mouth every 8 (eight) hours as needed for nausea. 10/22/18   Varney Biles, MD  pantoprazole (PROTONIX) 40 MG tablet Take 1 tablet (40 mg total) by mouth daily for 14 days. 07/28/18 08/11/18  Lavina Hamman, MD  thiamine 100 MG tablet Take 1 tablet (100 mg total) by mouth daily. 07/28/18   Lavina Hamman, MD    Family History Family History  Problem Relation Age of Onset  . Heart disease Mother   . Hypertension Mother   . Congestive Heart Failure Mother        Cause of death  . Congestive Heart Failure Father   . Stroke Father        Cause of death  . Hypertension Father   . Alcohol abuse Brother   . Hypertension Brother   . Heart disease  Maternal Grandmother   . Stroke Maternal Grandmother   . Diabetes Sister   . Hypertension Sister   . Heart disease Sister        Arrhythmia and myocarditis/Congenital Heart Disease/possible arrhythmia  . Diabetes Paternal Grandmother   . Diabetes Paternal Grandfather     Social History Social History   Tobacco Use  . Smoking status: Current Some Day Smoker    Types: Cigarettes    Start date: 10/09/1990  . Smokeless tobacco: Never Used  Substance Use Topics  . Alcohol use: Yes    Alcohol/week: 14.0 standard drinks    Types: 14 Shots of liquor per week    Comment: Couple glasses of wine once to twice weekly  . Drug use: No     Allergies   Cephalexin   Review of Systems Review of Systems  Constitutional: Positive for activity change.  Gastrointestinal: Positive for abdominal pain, diarrhea and nausea.  All other systems reviewed and are negative.    Physical Exam Updated Vital Signs BP 129/74   Pulse 84   Temp 97.8 F  (36.6 C) (Oral)   Resp 16   LMP  (LMP Unknown)   SpO2 97%   Physical Exam Vitals signs and nursing note reviewed.  Constitutional:      Appearance: She is well-developed.  HENT:     Head: Normocephalic and atraumatic.  Neck:     Musculoskeletal: Normal range of motion and neck supple.  Cardiovascular:     Rate and Rhythm: Normal rate.  Pulmonary:     Effort: Pulmonary effort is normal.  Abdominal:     General: Abdomen is flat. Bowel sounds are normal.     Tenderness: There is abdominal tenderness in the epigastric area and left upper quadrant.     Comments: Patient has diffuse tenderness over the upper quadrants, with left upper quadrant being the worst.  Patient also has epigastric tenderness.  There is no rebound or guarding  Skin:    General: Skin is warm and dry.  Neurological:     Mental Status: She is alert and oriented to person, place, and time.      ED Treatments / Results  Labs (all labs ordered are listed, but only abnormal results are displayed) Labs Reviewed  COMPREHENSIVE METABOLIC PANEL - Abnormal; Notable for the following components:      Result Value   Glucose, Bld 107 (*)    All other components within normal limits  CBC - Abnormal; Notable for the following components:   Hemoglobin 15.4 (*)    All other components within normal limits  LIPASE, BLOOD  URINALYSIS, ROUTINE W REFLEX MICROSCOPIC    EKG None  Radiology Ct Abdomen Pelvis W Contrast  Result Date: 10/22/2018 CLINICAL DATA:  Lower quadrant abdomen pain for several days. EXAM: CT ABDOMEN AND PELVIS WITH CONTRAST TECHNIQUE: Multidetector CT imaging of the abdomen and pelvis was performed using the standard protocol following bolus administration of intravenous contrast. CONTRAST:  127mL OMNIPAQUE IOHEXOL 300 MG/ML  SOLN COMPARISON:  None. FINDINGS: Lower chest: Mild atelectasis of posterior lung bases are noted. The heart size is upper limits of normal. Hepatobiliary: No focal liver  abnormality is seen. No gallstones, gallbladder wall thickening, or biliary dilatation. Pancreas: Unremarkable. No pancreatic ductal dilatation or surrounding inflammatory changes. Spleen: Normal in size without focal abnormality. Adrenals/Urinary Tract: Adrenal glands are unremarkable. Kidneys are normal, without renal calculi, focal lesion, or hydronephrosis. Bladder is unremarkable. Stomach/Bowel: There is diffuse bowel wall thickening of the colon.  There is diverticulosis of colon. There is no small bowel obstruction. The appendix is normal. The stomach is normal. Vascular/Lymphatic: Aortic atherosclerosis. No enlarged abdominal or pelvic lymph nodes. Reproductive: Uterus and bilateral adnexa are unremarkable. Other: No abdominal wall hernia or abnormality. No abdominopelvic ascites. Musculoskeletal: Degenerative joint changes of the spine are noted. IMPRESSION: Diffuse bowel wall thickening throughout the colon. This is nonspecific. Favor infectious/inflammatory etiology. No small bowel obstruction.  The appendix is normal. Electronically Signed   By: Abelardo Diesel M.D.   On: 10/22/2018 15:26    Procedures Procedures (including critical care time)  Medications Ordered in ED Medications  acetaminophen (TYLENOL) tablet 650 mg (has no administration in time range)  iohexol (OMNIPAQUE) 300 MG/ML solution 100 mL (100 mLs Intravenous Contrast Given 10/22/18 1505)     Initial Impression / Assessment and Plan / ED Course  I have reviewed the triage vital signs and the nursing notes.  Pertinent labs & imaging results that were available during my care of the patient were reviewed by me and considered in my medical decision making (see chart for details).     Patient comes in a chief complaint of abdominal pain. Patient does not have any significant medical history or abdominal surgical history.  She is noted to have significant tenderness in the upper quadrants without any rebound or guarding.  She  also is having loose bowel movements.  Differential diagnosis includes colitis, diverticulitis, small bowel obstruction, ileus. Patient was reassessed after initial work-up, she continued to have significant tenderness therefore CT scan ordered.  CT scan of the abdomen reveals that patient has colitis.  Given that there is no fever, white count and no recent antibiotic use we do not think this is a bacterial etiology for colitis and therefore we will treat conservatively.  Results of the ER work-up discussed with the patient along with strict ER return precautions.  Final Clinical Impressions(s) / ED Diagnoses   Final diagnoses:  Colitis without complication    ED Discharge Orders         Ordered    loperamide (IMODIUM) 2 MG capsule  At bedtime PRN     10/22/18 1558    acetaminophen (TYLENOL 8 HOUR) 650 MG CR tablet  Every 8 hours PRN     10/22/18 1558    ondansetron (ZOFRAN ODT) 8 MG disintegrating tablet  Every 8 hours PRN     10/22/18 Garland, Lannie Heaps, MD 10/24/18 2329

## 2018-10-22 NOTE — ED Notes (Signed)
Patient stated feels general weakness and not dizziness. Alert answering and following commands appropriate.

## 2018-10-22 NOTE — Discharge Instructions (Signed)
We saw you in the ER for diarrhea and lower abdominal pain. CT scan shows that you have colitis -which is likely a self-limiting process, as long as you support your body with appropriate fluid intake.  Please return to the ER if your symptoms worsen; you have increased pain, fevers, chills, bloody stools, inability to keep any medications down. Otherwise see the outpatient doctor as requested.

## 2018-10-22 NOTE — ED Triage Notes (Signed)
C/o abd. Pain onset several days ago. States she has had episodes of looseools and then constipation.

## 2018-10-29 ENCOUNTER — Emergency Department (HOSPITAL_COMMUNITY)
Admission: EM | Admit: 2018-10-29 | Discharge: 2018-10-29 | Disposition: A | Discharge status: Home / Self Care | Payer: BLUE CROSS/BLUE SHIELD | Attending: Emergency Medicine | Admitting: Emergency Medicine

## 2018-10-29 ENCOUNTER — Other Ambulatory Visit: Payer: Self-pay

## 2018-10-29 ENCOUNTER — Encounter (HOSPITAL_COMMUNITY): Payer: Self-pay | Admitting: Emergency Medicine

## 2018-10-29 DIAGNOSIS — R197 Diarrhea, unspecified: Secondary | ICD-10-CM | POA: Diagnosis not present

## 2018-10-29 DIAGNOSIS — F17210 Nicotine dependence, cigarettes, uncomplicated: Secondary | ICD-10-CM | POA: Diagnosis not present

## 2018-10-29 DIAGNOSIS — K529 Noninfective gastroenteritis and colitis, unspecified: Secondary | ICD-10-CM

## 2018-10-29 DIAGNOSIS — K5289 Other specified noninfective gastroenteritis and colitis: Secondary | ICD-10-CM | POA: Insufficient documentation

## 2018-10-29 DIAGNOSIS — D72829 Elevated white blood cell count, unspecified: Secondary | ICD-10-CM | POA: Insufficient documentation

## 2018-10-29 DIAGNOSIS — D729 Disorder of white blood cells, unspecified: Secondary | ICD-10-CM

## 2018-10-29 DIAGNOSIS — R1084 Generalized abdominal pain: Secondary | ICD-10-CM | POA: Diagnosis present

## 2018-10-29 DIAGNOSIS — Z79899 Other long term (current) drug therapy: Secondary | ICD-10-CM | POA: Diagnosis not present

## 2018-10-29 LAB — URINALYSIS, ROUTINE W REFLEX MICROSCOPIC
Bilirubin Urine: NEGATIVE
GLUCOSE, UA: NEGATIVE mg/dL
KETONES UR: 20 mg/dL — AB
Nitrite: NEGATIVE
PH: 5 (ref 5.0–8.0)
Protein, ur: 30 mg/dL — AB
SPECIFIC GRAVITY, URINE: 1.032 — AB (ref 1.005–1.030)

## 2018-10-29 LAB — COMPREHENSIVE METABOLIC PANEL
ALT: 25 U/L (ref 0–44)
AST: 31 U/L (ref 15–41)
Albumin: 2.9 g/dL — ABNORMAL LOW (ref 3.5–5.0)
Alkaline Phosphatase: 89 U/L (ref 38–126)
Anion gap: 16 — ABNORMAL HIGH (ref 5–15)
BUN: 8 mg/dL (ref 8–23)
CHLORIDE: 92 mmol/L — AB (ref 98–111)
CO2: 27 mmol/L (ref 22–32)
CREATININE: 0.83 mg/dL (ref 0.44–1.00)
Calcium: 8.9 mg/dL (ref 8.9–10.3)
Glucose, Bld: 100 mg/dL — ABNORMAL HIGH (ref 70–99)
Potassium: 3.8 mmol/L (ref 3.5–5.1)
Sodium: 135 mmol/L (ref 135–145)
TOTAL PROTEIN: 7.1 g/dL (ref 6.5–8.1)
Total Bilirubin: 1.1 mg/dL (ref 0.3–1.2)

## 2018-10-29 LAB — CBC WITH DIFFERENTIAL/PLATELET
ABS IMMATURE GRANULOCYTES: 0.06 10*3/uL (ref 0.00–0.07)
Basophils Absolute: 0.1 10*3/uL (ref 0.0–0.1)
Basophils Relative: 1 %
Eosinophils Absolute: 0.2 10*3/uL (ref 0.0–0.5)
Eosinophils Relative: 2 %
HCT: 43.4 % (ref 36.0–46.0)
Hemoglobin: 14.6 g/dL (ref 12.0–15.0)
Immature Granulocytes: 1 %
Lymphocytes Relative: 12 %
Lymphs Abs: 1.3 10*3/uL (ref 0.7–4.0)
MCH: 31.3 pg (ref 26.0–34.0)
MCHC: 33.6 g/dL (ref 30.0–36.0)
MCV: 93.1 fL (ref 80.0–100.0)
MONO ABS: 1.2 10*3/uL — AB (ref 0.1–1.0)
Monocytes Relative: 11 %
Neutro Abs: 8.7 10*3/uL — ABNORMAL HIGH (ref 1.7–7.7)
Neutrophils Relative %: 73 %
PLATELETS: 367 10*3/uL (ref 150–400)
RBC: 4.66 MIL/uL (ref 3.87–5.11)
RDW: 13 % (ref 11.5–15.5)
WBC: 11.6 10*3/uL — AB (ref 4.0–10.5)
nRBC: 0 % (ref 0.0–0.2)

## 2018-10-29 LAB — LIPASE, BLOOD: LIPASE: 21 U/L (ref 11–51)

## 2018-10-29 MED ORDER — HYDROCODONE-ACETAMINOPHEN 5-325 MG PO TABS: 1.0000 | ORAL_TABLET | Freq: Four times a day (QID) | ORAL | 0 refills | Status: DC | PRN

## 2018-10-29 MED ORDER — MORPHINE SULFATE (PF) 4 MG/ML IV SOLN
4.0000 mg | Freq: Once | INTRAVENOUS | Status: AC
  Administered 2018-10-29: 4 mg via INTRAVENOUS
  Filled 2018-10-29: qty 1

## 2018-10-29 MED ORDER — CIPROFLOXACIN HCL 500 MG PO TABS: 500.0000 mg | ORAL_TABLET | Freq: Two times a day (BID) | ORAL | 0 refills | Status: DC

## 2018-10-29 MED ORDER — SODIUM CHLORIDE 0.9 % IV BOLUS
1000.0000 mL | Freq: Once | INTRAVENOUS | Status: AC
  Administered 2018-10-29: 1000 mL via INTRAVENOUS

## 2018-10-29 MED ORDER — METRONIDAZOLE 500 MG PO TABS: 500.0000 mg | ORAL_TABLET | Freq: Three times a day (TID) | ORAL | 0 refills | Status: AC

## 2018-10-29 NOTE — ED Triage Notes (Signed)
Pt report severe abdominal pain and diarrhea x3 weeks.  Pt reports being seen in ED last week for the same issue.

## 2018-10-29 NOTE — ED Notes (Signed)
Patient verbalizes understanding of discharge instructions. Opportunity for questioning and answers were provided. Armband removed by staff, pt discharged from ED.  

## 2018-10-29 NOTE — Discharge Instructions (Signed)
Use zofran as prescribed, as needed for nausea. Alternate between ibuprofen and norco as directed as needed for pain but don't drive while taking norco. May consider using over the counter tums, maalox, pepto bismol, imodium, or other over the counter remedies to help with symptoms. Stay well hydrated with small sips of fluids throughout the day. Take antibiotics as directed until completed. Follow a BRAT (banana-rice-applesauce-toast) diet as described below for the next 24-48 hours. The 'BRAT' diet is suggested, then progress to diet as tolerated as symptoms abate. Call your regular doctor if bloody stools, persistent diarrhea, vomiting, fever or abdominal pain. Follow up with your regular doctor in 1 week for recheck of symptoms. Return to ER for changing or worsening of symptoms.

## 2018-10-29 NOTE — ED Provider Notes (Signed)
[facility] EMERGENCY DEPARTMENT Provider Note   CSN: [id number] Arrival date & time: 10/29/18  1704     History   Chief Complaint Chief Complaint  Patient presents with  . Abdominal Pain    HPI Shelley [name] is a 63 y.o. female with a PMHx of HTN, who presents to the ED with complaints of ongoing generalized abd pain and diarrhea that's been going on for almost 3 weeks. Chart review reveals that she was seen in the ED on 10/22/18 for similar complaints, labs were unremarkable, CT showed bowel wall thickening throughout the colon, favoring infectious or inflammatory etiology.  She was sent home with Imodium, Zofran, and Tylenol, no abx were given since they did not feel it was infectious in nature.  She states that despite these medications she continues to have severe pain so she came back for reassessment.  She describes the pain as 10/10 constant throbbing nonradiating generalized abdominal pain that worsens with eating and has been unrelieved with Tylenol and Pepto-Bismol.  She reports having 13 episodes a day of nonbloody diarrhea.  She denies any fevers, chills, chest pain, shortness of breath, nausea, vomiting, constipation, obstipation, melena, hematochezia, dysuria, hematuria, vaginal bleeding or discharge, myalgias, arthralgias, numbness, tingling, focal weakness, or any other complaints at this time.  She denies any recent travel, sick contacts, suspicious food intake, recent alcohol use, frequent NSAID use, or recent antibiotics.  Her PCP is at [facility].  The history is provided by the patient and medical records. No language interpreter was used.  Abdominal Pain  Associated symptoms: diarrhea   Associated symptoms: no chest pain, no chills, no constipation, no dysuria, no fever, no hematuria, no nausea, no shortness of breath, no vaginal bleeding, no vaginal discharge and no vomiting     Past Medical History:  Diagnosis Date  . Allergy    Environmental and Seasonal:  Pharyngeal drainage, cough and eye symptoms  . Elevated blood pressure reading without diagnosis of hypertension 2016  . Tobacco use     Patient Active Problem List   Diagnosis Date Noted  . CAP (community acquired pneumonia) 07/25/2018  . Tobacco use 07/25/2018  . Elevated bilirubin 07/25/2018  . Alcohol use 07/25/2018  . Environmental and seasonal allergies 10/16/2015    Past Surgical History:  Procedure Laterality Date  . 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 Authorizing Provider  acetaminophen (TYLENOL 8 HOUR) 650 MG CR tablet Take 1 tablet (650 mg total) by mouth every 8 (eight) hours as needed for pain or fever. 10/22/18   Varney Biles, MD  benzonatate (TESSALON) 100 MG capsule Take 1 capsule (100 mg total) by mouth 3 (three) times daily. 07/28/18   Lavina Hamman, MD  folic acid (FOLVITE) 1 MG tablet Take 1 tablet (1 mg total) by mouth daily. 07/28/18   Lavina Hamman, MD  loperamide (IMODIUM) 2 MG  capsule Take 2 capsules (4 mg total) by mouth at bedtime as needed for diarrhea or loose stools. 10/22/18   Varney Biles, MD  ondansetron (ZOFRAN ODT) 8 MG disintegrating tablet Take 1 tablet (8 mg total) by mouth every 8 (eight) hours as needed for nausea. 10/22/18   Varney Biles, MD  pantoprazole (PROTONIX) 40 MG tablet Take 1 tablet (40 mg total) by mouth daily for 14 days. 07/28/18 08/11/18  Lavina Hamman, MD  thiamine 100 MG tablet Take 1 tablet (100 mg total) by mouth daily. 07/28/18   Lavina Hamman, MD    Family History Family History  Problem Relation Age of Onset  . Heart disease Mother   . Hypertension Mother   .  Congestive Heart Failure Mother        Cause of death  . Congestive Heart Failure Father   . Stroke Father        Cause of death  . Hypertension Father   . Alcohol abuse Brother   . Hypertension Brother   . Heart disease Maternal Grandmother   . Stroke Maternal Grandmother   . Diabetes Sister   . Hypertension Sister   . Heart disease Sister        Arrhythmia and myocarditis/Congenital Heart Disease/possible arrhythmia  . Diabetes Paternal Grandmother   . Diabetes Paternal Grandfather     Social History Social History   Tobacco Use  . Smoking status: Current Some Day Smoker    Types: Cigarettes    Start date: 10/09/1990  . Smokeless tobacco: Never Used  Substance Use Topics  . Alcohol use: Yes    Alcohol/week: 14.0 standard drinks    Types: 14 Shots of liquor per week    Comment: Couple glasses of wine once to twice weekly  . Drug use: No     Allergies   Cephalexin   Review of Systems Review of Systems  Constitutional: Negative for chills and fever.  Respiratory: Negative for shortness of breath.   Cardiovascular: Negative for chest pain.  Gastrointestinal: Positive for abdominal pain and diarrhea. Negative for blood in stool, constipation, nausea and vomiting.  Genitourinary: Negative for dysuria, hematuria, vaginal bleeding and vaginal discharge.  Musculoskeletal: Negative for arthralgias and myalgias.  Skin: Negative for color change.  Allergic/Immunologic: Negative for immunocompromised state.  Neurological: Negative for weakness and numbness.  Psychiatric/Behavioral: Negative for confusion.   All other systems reviewed and are negative for acute change except as noted in the HPI.    Physical Exam Updated Vital Signs BP (!) 168/83 (BP Location: Right Arm)   Pulse (!) 103   Temp 99.5 F (37.5 C) (Oral)   Resp 18   Ht 5\' 6"  (1.676 m)   Wt 59.9 kg   LMP  (LMP Unknown)   SpO2 95%   BMI 21.31 kg/m   Physical Exam Vitals signs and nursing note reviewed.   Constitutional:      General: She is not in acute distress.    Appearance: Normal appearance. She is well-developed. She is not toxic-appearing.     Comments: Low grade temp 99.5, nontoxic, NAD  HENT:     Head: Normocephalic and atraumatic.     Mouth/Throat:     Mouth: Mucous membranes are dry.  Eyes:     General:        Right eye: No discharge.        Left eye: No discharge.     Conjunctiva/sclera: Conjunctivae normal.  Neck:     Musculoskeletal: Normal  range of motion and neck supple.  Cardiovascular:     Rate and Rhythm: Regular rhythm. Tachycardia present.     Pulses: Normal pulses.     Heart sounds: Normal heart sounds, S1 normal and S2 normal. No murmur. No friction rub. No gallop.      Comments: Marginally tachycardic in the low 100s Pulmonary:     Effort: Pulmonary effort is normal. No respiratory distress.     Breath sounds: Normal breath sounds. No decreased breath sounds, wheezing, rhonchi or rales.  Abdominal:     General: Bowel sounds are normal. There is no distension.     Palpations: Abdomen is soft. Abdomen is not rigid.     Tenderness: There is generalized abdominal tenderness. There is no right CVA tenderness, left CVA tenderness, guarding or rebound. Negative signs include Murphy's sign and McBurney's sign.     Comments: Soft, nondistended, +BS throughout, with moderate generalized abd TTP, no r/g/r, neg murphy's, neg mcburney's, no CVA TTP   Musculoskeletal: Normal range of motion.  Skin:    General: Skin is warm and dry.     Findings: No rash.  Neurological:     Mental Status: She is alert and oriented to person, place, and time.     Sensory: Sensation is intact. No sensory deficit.     Motor: Motor function is intact.  Psychiatric:        Mood and Affect: Mood and affect normal.        Behavior: Behavior normal.      ED Treatments / Results  Labs (all labs ordered are listed, but only abnormal results are displayed) Labs Reviewed  CBC WITH  DIFFERENTIAL/PLATELET - Abnormal; Notable for the following components:      Result Value   WBC 11.6 (*)    Neutro Abs 8.7 (*)    Monocytes Absolute 1.2 (*)    All other components within normal limits  COMPREHENSIVE METABOLIC PANEL - Abnormal; Notable for the following components:   Chloride 92 (*)    Glucose, Bld 100 (*)    Albumin 2.9 (*)    Anion gap 16 (*)    All other components within normal limits  URINALYSIS, ROUTINE W REFLEX MICROSCOPIC - Abnormal; Notable for the following components:   Color, Urine AMBER (*)    APPearance CLOUDY (*)    Specific Gravity, Urine 1.032 (*)    Hgb urine dipstick MODERATE (*)    Ketones, ur 20 (*)    Protein, ur 30 (*)    Leukocytes, UA TRACE (*)    Bacteria, UA RARE (*)    All other components within normal limits  LIPASE, BLOOD    EKG None  Radiology No results found.  Procedures Procedures (including critical care time)  Medications Ordered in ED Medications  sodium chloride 0.9 % bolus 1,000 mL (1,000 mLs Intravenous New Bag/Given 10/29/18 1735)  morphine 4 MG/ML injection 4 mg (4 mg Intravenous Given 10/29/18 1735)     Initial Impression / Assessment and Plan / ED Course  I have reviewed the triage vital signs and the nursing notes.  Pertinent labs & imaging results that were available during my care of the patient were reviewed by me and considered in my medical decision making (see chart for details).     63 y.o. female here with diffuse abdominal pain for the last 3 weeks as well as diarrhea.  Was already seen several days ago and had a CT which showed diffuse bowel wall thickening throughout  the colon, consistent with colitis.  Her labs were unremarkable at that visit, she was treated conservatively without antibiotics.  She presents today with continued pain and symptoms.  On exam, diffuse abdominal tenderness, non-peritoneal.  Low-grade temp 99.5, marginally tachycardic in the low 100s, lips look dry.  Does not appear  septic, could be dehydrated.  We will get repeat labs, but doubt need for repeat imaging.  Doubt ischemic colitis given distribution of the bowel wall thickening seen on prior CT.  Will give pain medicine, fluids, and reassess shortly.  Discussed with my attending Dr. Alvino Chapel who agrees with plan.  7:38 PM CBC w/diff with mildly elevated WBC 11.6. CMP slightly hemolyzed, marginally elevated anion gap probably just from dehydration, fluids given. Lipase WNL. U/A grossly contaminated with 11-20 squamous, no evidence of definite UTI. Pt feeling better. Colitis would make sense for her symptoms, given elevated WBC count and continued symptoms, will cover empirically with abx. Will also send home with pain meds. Advised adequate hydration and rest, other OTC remedies for symptomatic relief, and f/up with PCP in 1wk for recheck. If these symptoms persist, she may need to see GI for colonoscopy, but doubt need for urgent referral at this time. I explained the diagnosis and have given explicit precautions to return to the ER including for any other new or worsening symptoms. The patient understands and accepts the medical plan as it's been dictated and I have answered their questions. Discharge instructions concerning home care and prescriptions have been given. The patient is STABLE and is discharged to home in good condition.    Final Clinical Impressions(s) / ED Diagnoses   Final diagnoses:  Colitis  Generalized abdominal pain  Diarrhea, unspecified type  Neutrophilic leukocytosis    ED Discharge Orders         Ordered    ciprofloxacin (CIPRO) 500 MG tablet  2 times daily     10/29/18 1938    metroNIDAZOLE (FLAGYL) 500 MG tablet  3 times daily     10/29/18 1938    HYDROcodone-acetaminophen (NORCO) 5-325 MG tablet  Every 6 hours PRN     10/29/18 [address] [place], [place] 10/29/18 1938    Davonna Belling, MD 11/01/18 [phone]

## 2018-10-31 ENCOUNTER — Encounter (HOSPITAL_COMMUNITY): Payer: Self-pay | Admitting: Emergency Medicine

## 2018-10-31 ENCOUNTER — Encounter: Payer: Self-pay | Admitting: Physician Assistant

## 2018-10-31 ENCOUNTER — Other Ambulatory Visit: Payer: Self-pay

## 2018-10-31 ENCOUNTER — Emergency Department (HOSPITAL_COMMUNITY)
Admission: EM | Admit: 2018-10-31 | Discharge: 2018-11-01 | Disposition: A | Discharge status: Home / Self Care | Payer: BLUE CROSS/BLUE SHIELD | Attending: Emergency Medicine | Admitting: Emergency Medicine

## 2018-10-31 DIAGNOSIS — R109 Unspecified abdominal pain: Secondary | ICD-10-CM | POA: Diagnosis present

## 2018-10-31 DIAGNOSIS — Z5321 Procedure and treatment not carried out due to patient leaving prior to being seen by health care provider: Secondary | ICD-10-CM | POA: Diagnosis not present

## 2018-10-31 LAB — LIPASE, BLOOD: Lipase: 140 U/L — ABNORMAL HIGH (ref 11–51)

## 2018-10-31 LAB — COMPREHENSIVE METABOLIC PANEL
ALBUMIN: 2.8 g/dL — AB (ref 3.5–5.0)
ALT: 24 U/L (ref 0–44)
AST: 17 U/L (ref 15–41)
Alkaline Phosphatase: 94 U/L (ref 38–126)
Anion gap: 18 — ABNORMAL HIGH (ref 5–15)
BUN: 13 mg/dL (ref 8–23)
CO2: 24 mmol/L (ref 22–32)
Calcium: 8.9 mg/dL (ref 8.9–10.3)
Chloride: 92 mmol/L — ABNORMAL LOW (ref 98–111)
Creatinine, Ser: 1.18 mg/dL — ABNORMAL HIGH (ref 0.44–1.00)
GFR calc Af Amer: 56 mL/min — ABNORMAL LOW (ref >60)
GFR calc non Af Amer: 49 mL/min — ABNORMAL LOW (ref >60)
Glucose, Bld: 121 mg/dL — ABNORMAL HIGH (ref 70–99)
Potassium: 3.1 mmol/L — ABNORMAL LOW (ref 3.5–5.1)
SODIUM: 134 mmol/L — AB (ref 135–145)
Total Bilirubin: 0.7 mg/dL (ref 0.3–1.2)
Total Protein: 7.5 g/dL (ref 6.5–8.1)

## 2018-10-31 LAB — CBC
HCT: 46.2 % — ABNORMAL HIGH (ref 36.0–46.0)
Hemoglobin: 15.7 g/dL — ABNORMAL HIGH (ref 12.0–15.0)
MCH: 31.4 pg (ref 26.0–34.0)
MCHC: 34 g/dL (ref 30.0–36.0)
MCV: 92.4 fL (ref 80.0–100.0)
Platelets: 498 10*3/uL — ABNORMAL HIGH (ref 150–400)
RBC: 5 MIL/uL (ref 3.87–5.11)
RDW: 13 % (ref 11.5–15.5)
WBC: 7.5 10*3/uL (ref 4.0–10.5)
nRBC: 0 % (ref 0.0–0.2)

## 2018-10-31 MED ORDER — SODIUM CHLORIDE 0.9% FLUSH: 3.0000 mL | Freq: Once | INTRAVENOUS | Status: DC

## 2018-10-31 NOTE — ED Triage Notes (Signed)
C/o severe abd pain, nausea, and vomiting x 3 weeks.  States she has been seen for same and is not any better.

## 2018-11-01 NOTE — ED Notes (Signed)
Called pt x 3 no answer 

## 2018-11-07 ENCOUNTER — Ambulatory Visit: Payer: BLUE CROSS/BLUE SHIELD | Admitting: Physician Assistant

## 2020-01-31 ENCOUNTER — Emergency Department (HOSPITAL_COMMUNITY)
Admission: EM | Admit: 2020-01-31 | Discharge: 2020-01-31 | Disposition: A | Discharge status: Home / Self Care | Payer: Medicare Other | Attending: Emergency Medicine | Admitting: Emergency Medicine

## 2020-01-31 ENCOUNTER — Encounter (HOSPITAL_COMMUNITY): Payer: Self-pay | Admitting: Emergency Medicine

## 2020-01-31 ENCOUNTER — Emergency Department (HOSPITAL_COMMUNITY): Payer: Medicare Other

## 2020-01-31 ENCOUNTER — Other Ambulatory Visit: Payer: Self-pay

## 2020-01-31 DIAGNOSIS — Y929 Unspecified place or not applicable: Secondary | ICD-10-CM | POA: Insufficient documentation

## 2020-01-31 DIAGNOSIS — I10 Essential (primary) hypertension: Secondary | ICD-10-CM | POA: Insufficient documentation

## 2020-01-31 DIAGNOSIS — S62397A Other fracture of fifth metacarpal bone, left hand, initial encounter for closed fracture: Secondary | ICD-10-CM | POA: Diagnosis not present

## 2020-01-31 DIAGNOSIS — Y939 Activity, unspecified: Secondary | ICD-10-CM | POA: Diagnosis not present

## 2020-01-31 DIAGNOSIS — Y999 Unspecified external cause status: Secondary | ICD-10-CM | POA: Diagnosis not present

## 2020-01-31 DIAGNOSIS — Z720 Tobacco use: Secondary | ICD-10-CM | POA: Insufficient documentation

## 2020-01-31 DIAGNOSIS — S6992XA Unspecified injury of left wrist, hand and finger(s), initial encounter: Secondary | ICD-10-CM | POA: Diagnosis present

## 2020-01-31 NOTE — ED Notes (Signed)
Pt has not spoken with anyone about the injury and when asked was not interested in speaking to anyone.

## 2020-01-31 NOTE — ED Notes (Signed)
Patient verbalizes understanding of discharge instructions. Opportunity for questioning and answers were provided. Armband removed by staff, pt discharged from ED.  

## 2020-01-31 NOTE — Progress Notes (Signed)
Orthopedic Tech Progress Note Patient Details:  Shelley [name] 05/19/1956 [id number]  Ortho Devices Type of Ortho Device: Ulna gutter splint Ortho Device/Splint Location: LUE Ortho Device/Splint Interventions: Application   Post Interventions Patient Tolerated: Well Instructions Provided: Care of device   Keidan Aumiller E Fender Herder 01/31/2020, 8:58 PM
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
## 2020-01-31 NOTE — Discharge Instructions (Addendum)
You have been seen today for hand pain. Please read and follow all provided instructions. Return to the emergency room for worsening condition or new concerning symptoms.     The xray shows you have a broken bone in your left hand. The broken bone is your fifth metacarpal.  1. Medications:  Recommend you take Tylenol for pain.  Please take as directed on the bottle.  Continue usual home medications Take medications as prescribed. Please review all of the medicines and only take them if you do not have an allergy to them.   2. Treatment: Wear the splint until you see the bone doctor.  Do not get it wet.  3. Follow Up:  Please follow up with Dr. Fredna Dow.  He is our on-call hand doctor.  Call his office tomorrow to schedule your follow-up appointment.   It is also a possibility that you have an allergic reaction to any of the medicines that you have been prescribed - Everybody reacts differently to medications and while MOST people have no trouble with most medicines, you may have a reaction such as nausea, vomiting, rash, swelling, shortness of breath. If this is the case, please stop taking the medicine immediately and contact your physician.  ?
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
## 2020-03-13 ENCOUNTER — Encounter (INDEPENDENT_AMBULATORY_CARE_PROVIDER_SITE_OTHER): Payer: Medicare Other | Admitting: Podiatry

## 2020-03-13 DIAGNOSIS — M722 Plantar fascial fibromatosis: Secondary | ICD-10-CM

## 2020-03-13 NOTE — Progress Notes (Signed)
This encounter was created in error - please disregard.

## 2020-04-17 ENCOUNTER — Other Ambulatory Visit: Payer: Self-pay | Admitting: General Practice

## 2020-04-17 DIAGNOSIS — E2839 Other primary ovarian failure: Secondary | ICD-10-CM

## 2020-04-17 DIAGNOSIS — Z1231 Encounter for screening mammogram for malignant neoplasm of breast: Secondary | ICD-10-CM

## 2020-04-24 ENCOUNTER — Telehealth: Payer: Self-pay | Admitting: Internal Medicine

## 2020-04-24 NOTE — Telephone Encounter (Signed)
Patient contacted, she is requesting a consult to get spirometry and a consult. She is seeking a medical exception for her interlock device. Consult scheduled with Dr. Vaughan Browner.

## 2020-06-16 ENCOUNTER — Ambulatory Visit (INDEPENDENT_AMBULATORY_CARE_PROVIDER_SITE_OTHER): Payer: Medicare Other | Admitting: Pulmonary Disease

## 2020-06-16 ENCOUNTER — Encounter: Payer: Self-pay | Admitting: Pulmonary Disease

## 2020-06-16 ENCOUNTER — Other Ambulatory Visit: Payer: Self-pay

## 2020-06-16 VITALS — BP 130/88 | HR 71 | Temp 97.6°F | Ht 66.0 in | Wt 141.8 lb

## 2020-06-16 DIAGNOSIS — R0602 Shortness of breath: Secondary | ICD-10-CM | POA: Diagnosis not present

## 2020-06-16 NOTE — Patient Instructions (Addendum)
We will schedule you for spirometry for evaluation of your lung function Follow-up as needed.

## 2020-06-16 NOTE — Progress Notes (Signed)
[facility]    [id number]    [birth year]  Primary Care Physician:Patel, Lucila Maine, DO  Referring Physician: No referring provider defined for this encounter.  Chief complaint: Consult for dyspnea  HPI: 64 year old with history of allergies, hypertension Currently has an interlock device on her car and is unable to blow into it to activate the device She requires spirometry and DMV paperwork to be filled out  She is an active smoker, with chronic dyspnea on exertion Never diagnosed with COPD or asthma.  Not on inhalers   Outpatient Encounter Medications as of 06/16/2020  Medication Sig  . amLODipine (NORVASC) 5 MG tablet Take 5 mg by mouth daily.  Marland Kitchen aspirin EC 81 MG tablet Take 81 mg by mouth daily. Swallow whole.  . RED YEAST RICE EXTRACT PO Take by mouth.  . rosuvastatin (CRESTOR) 10 MG tablet Take 10 mg by mouth at bedtime. (Patient not taking: Reported on 06/16/2020)  . [DISCONTINUED] acetaminophen (TYLENOL 8 HOUR) 650 MG CR tablet Take 1 tablet (650 mg total) by mouth every 8 (eight) hours as needed for pain or fever. (Patient not taking: Reported on 10/29/2018)  . [DISCONTINUED] acetaminophen (TYLENOL) 500 MG tablet Take 500 mg by mouth every 6 (six) hours as needed for mild pain.   . [DISCONTINUED] benzonatate (TESSALON) 100 MG capsule Take 1 capsule (100 mg total) by mouth 3 (three) times daily. (Patient not taking: Reported on 10/29/2018)  . [DISCONTINUED] ciprofloxacin (CIPRO) 500 MG tablet Take 1 tablet (500 mg total) by mouth 2 (two) times daily. One po bid x 7 days  . [DISCONTINUED] folic acid (FOLVITE) 1 MG tablet Take 1 tablet (1 mg total) by mouth daily. (Patient not taking: Reported on 10/29/2018)  . [DISCONTINUED] HYDROcodone-acetaminophen (NORCO) 5-325 MG tablet Take 1 tablet by mouth every 6 (six) hours as needed for severe pain.  . [DISCONTINUED] loperamide (IMODIUM) 2 MG capsule Take 2 capsules (4 mg total) by mouth at bedtime as needed for diarrhea or loose  stools. (Patient not taking: Reported on 10/29/2018)  . [DISCONTINUED] ondansetron (ZOFRAN ODT) 8 MG disintegrating tablet Take 1 tablet (8 mg total) by mouth every 8 (eight) hours as needed for nausea. (Patient not taking: Reported on 10/29/2018)  . [DISCONTINUED] pantoprazole (PROTONIX) 40 MG tablet Take 1 tablet (40 mg total) by mouth daily for 14 days. (Patient not taking: Reported on 10/29/2018)  . [DISCONTINUED] thiamine 100 MG tablet Take 1 tablet (100 mg total) by mouth daily. (Patient not taking: Reported on 10/29/2018)   No facility-administered encounter medications on file as of 06/16/2020.    Allergies as of 06/16/2020 - Review Complete 01/31/2020  Allergen Reaction Noted  . Cephalexin Diarrhea 10/15/2005  . Ciprofloxacin Nausea And Vomiting 11/16/2018  . Metronidazole Nausea And Vomiting 11/16/2018    Past Medical History:  Diagnosis Date  . Allergy    Environmental and Seasonal:  Pharyngeal drainage, cough and eye symptoms  . Elevated blood pressure reading without diagnosis of hypertension 2016  . Tobacco use     Past Surgical History:  Procedure Laterality Date  . 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 Heart disease Mother   . Hypertension Mother   . Congestive Heart Failure Mother        Cause of death  . Congestive Heart Failure Father   . Stroke Father  Cause of death  . Hypertension Father   . Alcohol abuse Brother   . Hypertension Brother   . Heart disease Maternal Grandmother   . Stroke Maternal Grandmother   . Diabetes Sister   . Hypertension Sister   . Heart disease Sister        Arrhythmia and myocarditis/Congenital Heart Disease/possible arrhythmia  . Diabetes Paternal Grandmother   . Diabetes Paternal Grandfather     Social History   Socioeconomic History  . Marital status: Single    Spouse name: Not on file  . Number of children: 2  . Years of  education: 2  . 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  Looking to get employment with childcare.  Tobacco Use  . Smoking status: Current Some Day Smoker    Types: Cigarettes    Start date: 10/09/1990  . Smokeless tobacco: Never Used  . Tobacco comment: 1 cigg once a week  Substance and Sexual Activity  . Alcohol use: Yes    Alcohol/week: 14.0 standard drinks    Types: 14 Shots of liquor per week    Comment: Couple glasses of wine once to twice weekly  . Drug use: No  . Sexual activity: Yes    Birth control/protection: None  Other Topics Concern  . Not on file  Social History Narrative   Born and raised in [place].   Went to school for speech pathology and communication--spent time at [employer] and [employer]--2 years of college   Lives with 2 daughters, ages 9 and 83 yo.   Social Determinants of Health   Financial Resource Strain:   . Difficulty of Paying Living Expenses: Not on file  Food Insecurity:   . Worried About Charity fundraiser in the Last Year: Not on file  . Ran Out of Food in the Last Year: Not on file  Transportation Needs:   . Lack of Transportation (Medical): Not on file  . Lack of Transportation (Non-Medical): Not on file  Physical Activity:   . Days of Exercise per Week: Not on file  . Minutes of Exercise per Session: Not on file  Stress:   . Feeling of Stress : Not on file  Social Connections:   . Frequency of Communication with Friends and Family: Not on file  . Frequency of Social Gatherings with Friends and Family: Not on file  . Attends Religious Services: Not on file  . Active Member of Clubs or Organizations: Not on file  . Attends Archivist Meetings: Not on file  . Marital Status: Not on file  Intimate Partner Violence:   . Fear of Current or Ex-Partner: Not on file  . Emotionally Abused: Not on file  . Physically Abused: Not on file    . Sexually Abused: Not on file    Review of systems: Review of Systems  Constitutional: Negative for fever and chills.  HENT: Negative.   Eyes: Negative for blurred vision.  Respiratory: as per HPI  Cardiovascular: Negative for chest pain and palpitations.  Gastrointestinal: Negative for vomiting, diarrhea, blood per rectum. Genitourinary: Negative for dysuria, urgency, frequency and hematuria.  Musculoskeletal: Negative for myalgias, back pain and joint pain.  Skin: Negative for itching and rash.  Neurological: Negative for dizziness, tremors, focal weakness, seizures and loss of consciousness.  Endo/Heme/Allergies: Negative for environmental allergies.  Psychiatric/Behavioral: Negative for depression, suicidal ideas and hallucinations.  All  other systems reviewed and are negative.  Physical Exam: Blood pressure 130/88, pulse 71, temperature 97.6 F (36.4 C), temperature source Oral, height 5\' 6"  (1.676 m), weight 141 lb 12.8 oz (64.3 kg), SpO2 97 %. Gen:      No acute distress HEENT:  EOMI, sclera anicteric Neck:     No masses; no thyromegaly Lungs:    Clear to auscultation bilaterally; normal respiratory effort CV:         Regular rate and rhythm; no murmurs Abd:      + bowel sounds; soft, non-tender; no palpable masses, no distension Ext:    No edema; adequate peripheral perfusion Skin:      Warm and dry; no rash Neuro: alert and oriented x 3 Psych: normal mood and affect  Data Reviewed: Imaging: Chest x-ray 07/25/2018-patchy infiltrate in the right middle lobe. CT abdomen pelvis 10/22/2018-mild atelectasis at the base, diffuse bowel wall thickening  PFTs:  Labs:  Assessment:  Dyspnea Needs spirometry for DME paperwork as she has an interlock device in her car and is unable to blow to activate it.  Unfortunately she is not vaccinated against Covid and will need testing prior to the test This has been arranged for 9/23.  I have advised her to bring her paperwork so  that we can fill it immediately after her procedure  Plan/Recommendations: Spirometry on 9/23  Marshell Garfinkel MD [facility] 06/16/2020, 4:16 PM  CC: No ref. provider found

## 2020-06-25 ENCOUNTER — Other Ambulatory Visit: Payer: Self-pay | Admitting: Pulmonary Disease

## 2020-06-25 DIAGNOSIS — R0602 Shortness of breath: Secondary | ICD-10-CM

## 2020-06-27 ENCOUNTER — Encounter: Payer: Self-pay | Admitting: Pulmonary Disease

## 2020-07-07 ENCOUNTER — Telehealth: Payer: Medicare Other | Admitting: Internal Medicine

## 2020-12-29 ENCOUNTER — Encounter: Payer: Self-pay | Admitting: Physician Assistant

## 2021-01-08 ENCOUNTER — Encounter: Payer: Self-pay | Admitting: Physician Assistant

## 2021-01-08 ENCOUNTER — Ambulatory Visit (INDEPENDENT_AMBULATORY_CARE_PROVIDER_SITE_OTHER): Payer: Medicare Other | Admitting: Physician Assistant

## 2021-01-08 VITALS — BP 120/70 | HR 70 | Ht 67.0 in | Wt 122.8 lb

## 2021-01-08 DIAGNOSIS — R195 Other fecal abnormalities: Secondary | ICD-10-CM | POA: Diagnosis not present

## 2021-01-08 DIAGNOSIS — R634 Abnormal weight loss: Secondary | ICD-10-CM

## 2021-01-08 MED ORDER — CLENPIQ 10-3.5-12 MG-GM -GM/160ML PO SOLN: 320.0000 mL | Freq: Once | ORAL | 0 refills | Status: AC

## 2021-01-08 NOTE — Patient Instructions (Signed)
If you are age 66 or older, your body mass index should be between 23-30. Your Body mass index is 19.23 kg/m. If this is out of the aforementioned range listed, please consider follow up with your Primary Care Provider.  If you are age 57 or younger, your body mass index should be between 19-25. Your Body mass index is 19.23 kg/m. If this is out of the aformentioned range listed, please consider follow up with your Primary Care Provider.   You have been scheduled for an endoscopy. Please follow written instructions given to you at your visit today. If you use inhalers (even only as needed), please bring them with you on the day of your procedure.  Thank you for choosing me and [facility].  Ellouise Newer, PA-C

## 2021-01-08 NOTE — Progress Notes (Signed)
Chief Complaint: Weight loss, positive Hemoccult  HPI:    Shelley [name] is a 65 year old African-American female with a past medical history as listed below, who was referred to me by Andree Moro, DO for a complaint of weight loss and positive Hemoccult testing.      11/20/2018 + for C. difficile and norovirus.    Today, the patient presents to clinic and tells me that she is worried because she has been putting off her colonoscopy so long.  Describes that over the past 4 months she has lost about 12 to 15 pounds without really trying, though she was told she had high cholesterol so she has just been eating "rabbit food and not as much", when previously she was eating a lot of fried and unhealthy foods.  Also describes that she can have occasional diarrhea which seems worse when she is nervous or anxious.    Does describe seeing "pink stool", that looks like blood occasionally, this is worse if she drinks any alcohol so she has stopped drinking alcohol completely.    Also describes some reflux symptoms which are only very occasional and typically only if she eats a lot of red sauce like on spaghetti etc.    Denies fever, chills, change in bowel habits, abdominal pain or symptoms that awaken her from sleep.  Past Medical History:  Diagnosis Date  . Allergy    Environmental and Seasonal:  Pharyngeal drainage, cough and eye symptoms  . Elevated blood pressure reading without diagnosis of hypertension 2016  . Tobacco use     Past Surgical History:  Procedure Laterality Date  . 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 amLODipine (NORVASC) 5 MG tablet Take 5 mg by mouth daily.    Marland Kitchen aspirin EC 81 MG tablet Take 81 mg by mouth daily. Swallow whole.    . RED YEAST RICE EXTRACT PO Take by mouth.    . rosuvastatin (CRESTOR) 10 MG tablet Take 10 mg by mouth at bedtime. (Patient not taking: Reported on  06/16/2020)     No current facility-administered medications for this visit.    Allergies as of 01/08/2021 - Review Complete 01/31/2020  Allergen Reaction Noted  . Cephalexin Diarrhea 10/15/2005  . Ciprofloxacin Nausea And Vomiting 11/16/2018  . Metronidazole Nausea And Vomiting 11/16/2018    Family History  Problem Relation Age of Onset  . Heart disease Mother   . Hypertension Mother   . Congestive Heart Failure Mother        Cause of death  . Congestive Heart Failure Father   . Stroke Father        Cause of death  . Hypertension Father   . Alcohol abuse Brother   . Hypertension Brother   . Heart disease Maternal Grandmother   . Stroke Maternal Grandmother   . Diabetes Sister   . Hypertension Sister   . Heart disease Sister        Arrhythmia and myocarditis/Congenital Heart Disease/possible arrhythmia  . Diabetes Paternal Grandmother   . Diabetes Paternal Grandfather     Social History   Socioeconomic History  . Marital status: Single    Spouse name: Not on file  . Number of children: 2  . Years of education: 55  . 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  Looking to get employment with childcare.  Tobacco Use  . Smoking status: Current Some Day Smoker    Types: Cigarettes    Start date: 10/09/1990  . Smokeless tobacco: Never Used  . Tobacco comment: 1 cigg once a week  Substance and Sexual Activity  . Alcohol use: Yes    Alcohol/week: 14.0 standard drinks    Types: 14 Shots of liquor per week    Comment: Couple glasses of wine once to twice weekly  . Drug use: No  . Sexual activity: Yes    Birth control/protection: None  Other Topics Concern  . Not on file  Social History Narrative   Born and raised in [place].   Went to school for speech pathology and communication--spent time at [employer] and [employer]--2 years of college   Lives with 2 daughters, ages 62 and 82  yo.   Social Determinants of Health   Financial Resource Strain: Not on file  Food Insecurity: Not on file  Transportation Needs: Not on file  Physical Activity: Not on file  Stress: Not on file  Social Connections: Not on file  Intimate Partner Violence: Not on file    Review of Systems:    Constitutional: No weight loss, fever or chills Skin: No rash  Cardiovascular: No chest pain Respiratory: No SOB  Gastrointestinal: See HPI and otherwise negative Genitourinary: No dysuria  Neurological: No headache, dizziness or syncope Musculoskeletal:+hip pain Hematologic: No  bruising Psychiatric: No history of depression or anxiety   Physical Exam:  Vital signs: BP 120/70   Pulse 70   Ht 5\' 7"  (1.702 m)   Wt 122 lb 12.8 oz (55.7 kg)   LMP  (LMP Unknown)   SpO2 98%   BMI 19.23 kg/m   Constitutional:   Pleasant AA female appears to be in NAD, Well developed, Well nourished, alert and cooperative Head:  Normocephalic and atraumatic. Eyes:   PEERL, EOMI. No icterus. Conjunctiva pink. Ears:  Normal auditory acuity. Neck:  Supple Throat: Oral cavity and pharynx without inflammation, swelling or lesion.  Respiratory: Respirations even and unlabored. Lungs clear to auscultation bilaterally.   No wheezes, crackles, or rhonchi.  Cardiovascular: Normal S1, S2. No MRG. Regular rate and rhythm. No peripheral edema, cyanosis or pallor.  Gastrointestinal:  Soft, nondistended, nontender. No rebound or guarding. Normal bowel sounds. No appreciable masses or hepatomegaly. Rectal:  Not performed.  Msk:  Symmetrical without gross deformities. Without edema, no deformity or joint abnormality.  Neurologic:  Alert and  oriented x4;  grossly normal neurologically.  Skin:   Dry and intact without significant lesions or rashes. Psychiatric: Demonstrates good judgement and reason without abnormal affect or behaviors.  Requesting recent labs.  Assessment: 1.  Positive Hemoccult testing: By PCP, we  are requesting results, patient does see "pink in her stool" occasionally, worse with alcohol; consider hemorrhoids versus other 2.  Weight loss: 12 to 15 pounds over the past 4 months per patient, did alter diet due to high cholesterol ; consider relation to healthy diet versus other  Plan: 1.  We are requesting records from patient's PCP in regards to positive Hemoccult testing. 2.  Scheduled patient for an EGD and colonoscopy given weight loss and positive Hemoccult testing.  These procedures were scheduled with Dr. Lyndel Safe in the [facility] given that he had sooner availability.  Patient will continue to follow with Dr. Lyndel Safe after time of procedures as her primary GI physician.  Did provide the patient with a detailed list of  risks for procedures and she agrees to proceed. 3.  Patient to follow in clinic per recommendations removed after time of procedures.  Ellouise Newer, PA-C [facility] 01/08/2021, 10:51 AM  Cc: Andree Moro, DO

## 2021-02-12 ENCOUNTER — Ambulatory Visit (AMBULATORY_SURGERY_CENTER): Payer: Medicare Other | Admitting: Gastroenterology

## 2021-02-12 ENCOUNTER — Encounter: Payer: Self-pay | Admitting: Gastroenterology

## 2021-02-12 ENCOUNTER — Other Ambulatory Visit: Payer: Self-pay

## 2021-02-12 VITALS — BP 128/72 | HR 75 | Temp 97.5°F | Resp 17 | Ht 67.0 in | Wt 122.0 lb

## 2021-02-12 DIAGNOSIS — D122 Benign neoplasm of ascending colon: Secondary | ICD-10-CM

## 2021-02-12 DIAGNOSIS — K2951 Unspecified chronic gastritis with bleeding: Secondary | ICD-10-CM

## 2021-02-12 DIAGNOSIS — K621 Rectal polyp: Secondary | ICD-10-CM

## 2021-02-12 DIAGNOSIS — R634 Abnormal weight loss: Secondary | ICD-10-CM | POA: Diagnosis not present

## 2021-02-12 DIAGNOSIS — K6389 Other specified diseases of intestine: Secondary | ICD-10-CM | POA: Diagnosis not present

## 2021-02-12 DIAGNOSIS — R195 Other fecal abnormalities: Secondary | ICD-10-CM | POA: Diagnosis not present

## 2021-02-12 DIAGNOSIS — K2970 Gastritis, unspecified, without bleeding: Secondary | ICD-10-CM

## 2021-02-12 DIAGNOSIS — D128 Benign neoplasm of rectum: Secondary | ICD-10-CM

## 2021-02-12 DIAGNOSIS — K643 Fourth degree hemorrhoids: Secondary | ICD-10-CM | POA: Diagnosis not present

## 2021-02-12 DIAGNOSIS — K5730 Diverticulosis of large intestine without perforation or abscess without bleeding: Secondary | ICD-10-CM

## 2021-02-12 DIAGNOSIS — D125 Benign neoplasm of sigmoid colon: Secondary | ICD-10-CM

## 2021-02-12 DIAGNOSIS — K3189 Other diseases of stomach and duodenum: Secondary | ICD-10-CM

## 2021-02-12 MED ORDER — PANTOPRAZOLE SODIUM 40 MG PO TBEC: 40.0000 mg | DELAYED_RELEASE_TABLET | Freq: Every day | ORAL | 5 refills | Status: DC

## 2021-02-12 MED ORDER — SODIUM CHLORIDE 0.9 % IV SOLN: 500.0000 mL | Freq: Once | INTRAVENOUS | Status: DC

## 2021-02-12 NOTE — Progress Notes (Signed)
Called to room to assist during endoscopic procedure.  Patient ID and intended procedure confirmed with present staff. Received instructions for my participation in the procedure from the performing physician.  

## 2021-02-12 NOTE — Progress Notes (Signed)
To PACU, VSS. Report to [email or web address] 

## 2021-02-12 NOTE — Patient Instructions (Addendum)
Check weight every week ,report if any further weight loss   Follow up appointment with Alen Blew ,PA in 8 weeks - make this appointment   Avoid Ibuprofen, Naproxen, or other non- steroidal anti inflammatory drugs    Handouts on polyps given to you today   Handout on gastritis given to you today   Await pathology results on polyps removed from colon and biopsies from stomach   Pick up Protonix from your pharmacy    YOU [place]:   Refer to the procedure report that was given to you for any specific questions about what was found during the examination.  If the procedure report does not answer your questions, please call your gastroenterologist to clarify.  If you requested that your care partner not be given the details of your procedure findings, then the procedure report has been included in a sealed envelope for you to review at your convenience later.  YOU SHOULD EXPECT: Some feelings of bloating in the abdomen. Passage of more gas than usual.  Walking can help get rid of the air that was put into your GI tract during the procedure and reduce the bloating. If you had a lower endoscopy (such as a colonoscopy or flexible sigmoidoscopy) you may notice spotting of blood in your stool or on the toilet paper. If you underwent a bowel prep for your procedure, you may not have a normal bowel movement for a few days.  Please Note:  You might notice some irritation and congestion in your nose or some drainage.  This is from the oxygen used during your procedure.  There is no need for concern and it should clear up in a day or so.  SYMPTOMS TO REPORT IMMEDIATELY:   Following lower endoscopy (colonoscopy or flexible sigmoidoscopy):  Excessive amounts of blood in the stool  Significant tenderness or worsening of abdominal pains  Swelling of the abdomen that is new, acute  Fever of 100F or higher   Following upper endoscopy  (EGD)  Vomiting of blood or coffee ground material  New chest pain or pain under the shoulder blades  Painful or persistently difficult swallowing  New shortness of breath  Fever of 100F or higher  Black, tarry-looking stools  For urgent or emergent issues, a gastroenterologist can be reached at any hour by calling [phone]. Do not use MyChart messaging for urgent concerns.    DIET:  We do recommend a small meal at first, but then you may proceed to your regular diet.  Drink plenty of fluids but you should avoid alcoholic beverages for 24 hours.  ACTIVITY:  You should plan to take it easy for the rest of today and you should NOT DRIVE or use heavy machinery until tomorrow (because of the sedation medicines used during the test).    FOLLOW UP: Our staff will call the number listed on your records 48-72 hours following your procedure to check on you and address any questions or concerns that you may have regarding the information given to you following your procedure. If we do not reach you, we will leave a message.  We will attempt to reach you two times.  During this call, we will ask if you have developed any symptoms of COVID 19. If you develop any symptoms (ie: fever, flu-like symptoms, shortness of breath, cough etc.) before then, please call [phone].  If you test positive for Covid 19 in the 2 weeks post procedure,  please call and report this information to [place].    If any biopsies were taken you will be contacted by phone or by letter within the next 1-3 weeks.  Please call us at [phone] if you have not heard about the biopsies in 3 weeks.    SIGNATURES/CONFIDENTIALITY: You and/or your care partner have signed paperwork which will be entered into your electronic medical record.  These signatures attest to the fact that that the information above on your After Visit Summary has been reviewed and is understood.  Full responsibility of the confidentiality of this discharge  information lies with you and/or your care-partner.

## 2021-02-12 NOTE — Op Note (Signed)
[place] Patient Name: Shelley [name] , Shelley Age: 65 Referring Shelley:  Date of Birth: Shelley [name], Shelley [name] Account #: [id number] Procedure:                Colonoscopy Indications:              Positive Hemoccult stools. Medicines:                Monitored Anesthesia Care Procedure:                Pre-Anesthesia Assessment:                           - Prior to the procedure, a History and Physical                            was performed, and patient medications and                            allergies were reviewed. The patient's tolerance of                            previous anesthesia was also reviewed. The risks                            and benefits of the procedure and the sedation                            options and risks were discussed with the patient.                            All questions were answered, and informed consent                            was obtained. Prior Anticoagulants: The patient has                            taken no previous anticoagulant or antiplatelet                            agents. ASA Grade Assessment: II - A patient with                            mild systemic disease. After reviewing the risks                            and benefits, the patient was deemed in                            satisfactory condition to undergo the procedure.                           After obtaining informed consent, the colonoscope  was passed under direct vision. Throughout the                            procedure, the patient's blood pressure, pulse, and                            oxygen saturations were monitored continuously. The                            Olympus PCF-H190DL ([id number]) Colonoscope was                            introduced through the anus and advanced to the 2                            cm into the ileum. The colonoscopy was  performed                            without difficulty. The patient tolerated the                            procedure well. The quality of the bowel                            preparation was adequate to identify polyps 6 mm                            and larger in size. There was retained adherent                            stool and some solid stool in several areas of the                            colon. Nevertheless, aggressive suctioning and                            aspiration was performed. The colon was highly                            redundant. Overall over 90-95% of colonic mucosa                            was visualized satisfactorily. The terminal ileum,                            ileocecal valve, appendiceal orifice, and rectum                            were photographed. Scope In: 3:06:00 PM Scope Out: 3:32:28 PM Scope Withdrawal Time: 0 hours [name] minutes 29 seconds  Total Procedure Duration: 0 hours 26 minutes 28 seconds  Findings:                 A 2 mm polyp was found in the  proximal ascending                            colon. The polyp was sessile. The polyp was removed                            with a cold biopsy forceps. Resection and retrieval                            were complete.                           A 8 mm polyp was found in the distal rectum. The                            polyp was sessile. The polyp was removed with a                            cold snare. Resection and retrieval were complete.                           Multiple small and large-mouthed diverticula were                            found in the entire colon. 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                            Multiple biopsies were taken with a cold forceps                            for histology.                           Non-bleeding internal hemorrhoids were found during                             retroflexion. 1 channel of hemorrhoids were                            moderate and Grade IV (internal hemorrhoids that                            prolapse and cannot be reduced manually).                           The terminal ileum appeared normal.                           The exam was otherwise without abnormality on  direct and retroflexion views. The colon was highly                            redundant. Complications:            No immediate complications. Estimated Blood Loss:     Estimated blood loss: none. Impression:               - Small colonic polyps s/p polypectomy.                           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                           - The examined portion of the ileum was normal.                           - The examination was otherwise normal on direct                            and retroflexion views. Recommendation:           - Patient has a contact number available for                            emergencies. The signs and symptoms of potential                            delayed complications were discussed with the                            patient. Return to normal activities tomorrow.                            Written discharge instructions were provided to the                            patient.                           - Resume previous diet.                           - Continue present medications.                           - Await pathology results.                           -  Repeat colonoscopy for surveillance based on                            pathology results.                           - Check weight every week. Patient is to report if                            there is any further weight loss. If she does,                             would proceed with CT Abdo/pelvis.                           - FU in GI clinic with Shelley [place] PA in 8 to                            12 weeks.                           - The findings and recommendations were discussed                            with the patient's family. Lynann Bologna, Shelley 02/12/2021 3:45:57 PM This report has been signed electronically.

## 2021-02-12 NOTE — Op Note (Signed)
[place] Patient Name: Shelley [name] Procedure Date: 02/12/2021 2:51 PM MRN: [id number] Endoscopist: Jackquline Denmark , MD Age: 65 Referring MD:  Date of Birth: Sep 22, 1956 Gender: Female Account #: [id number] Procedure:                Upper GI endoscopy Indications:              Epigastric abdominal pain, Wt Loss Medicines:                Monitored Anesthesia Care Procedure:                Pre-Anesthesia Assessment:                           - Prior to the procedure, a History and Physical                            was performed, and patient medications and                            allergies were reviewed. The patient's tolerance of                            previous anesthesia was also reviewed. The risks                            and benefits of the procedure and the sedation                            options and risks were discussed with the patient.                            All questions were answered, and informed consent                            was obtained. Prior Anticoagulants: The patient has                            taken no previous anticoagulant or antiplatelet                            agents. ASA Grade Assessment: II - A patient with                            mild systemic disease. After reviewing the risks                            and benefits, the patient was deemed in                            satisfactory condition to undergo the procedure.                           After obtaining informed consent, the endoscope was  passed under direct vision. Throughout the                            procedure, the patient's blood pressure, pulse, and                            oxygen saturations were monitored continuously. The                            Endoscope was introduced through the mouth, and                            advanced to the second part of duodenum. The upper                            GI endoscopy was  accomplished without difficulty.                            The patient tolerated the procedure well. Scope In: Scope Out: Findings:                 The examined esophagus was normal with well-defined                            Z-line at 35 cm, examined by NBI.                           Localized moderate inflammation characterized by                            erythema and friability was found in the gastric                            antrum. Biopsies were taken with a cold forceps for                            histology.                           Localized mildly erythematous mucosa without active                            bleeding and with no stigmata of bleeding was found                            in the duodenal bulb and in the first portion of                            the duodenum. Biopsies were taken with a cold                            forceps for histology. The second portion of the  duodenum was normal. Complications:            No immediate complications. Estimated Blood Loss:     Estimated blood loss: none. Impression:               - Moderate gastroduodenitis. Recommendation:           - Patient has a contact number available for                            emergencies. The signs and symptoms of potential                            delayed complications were discussed with the                            patient. Return to normal activities tomorrow.                            Written discharge instructions were provided to the                            patient.                           - Resume previous diet.                           - Continue present medications.                           - Protonix 40 mg p.o. once a day #30, 5 refills                           - Await pathology results.                           - Avoid ibuprofen, naproxen, or other non-steroidal                            anti-inflammatory drugs.                            - The findings and recommendations were discussed                            with the patient's family. Jackquline Denmark, MD 02/12/2021 3:38:38 PM This report has been signed electronically.

## 2021-02-16 ENCOUNTER — Telehealth: Payer: Self-pay

## 2021-02-16 NOTE — Telephone Encounter (Signed)
LVM

## 2021-02-25 ENCOUNTER — Encounter: Payer: Self-pay | Admitting: Gastroenterology

## 2021-03-25 ENCOUNTER — Ambulatory Visit (INDEPENDENT_AMBULATORY_CARE_PROVIDER_SITE_OTHER): Payer: Medicare Other | Admitting: Primary Care

## 2021-03-25 ENCOUNTER — Encounter: Payer: Self-pay | Admitting: Primary Care

## 2021-03-25 ENCOUNTER — Other Ambulatory Visit: Payer: Self-pay

## 2021-03-25 VITALS — BP 104/60 | HR 62

## 2021-03-25 DIAGNOSIS — R0600 Dyspnea, unspecified: Secondary | ICD-10-CM | POA: Insufficient documentation

## 2021-03-25 DIAGNOSIS — R0609 Other forms of dyspnea: Secondary | ICD-10-CM | POA: Diagnosis not present

## 2021-03-25 DIAGNOSIS — R0602 Shortness of breath: Secondary | ICD-10-CM

## 2021-03-25 NOTE — Progress Notes (Signed)
@Patient  ID: Shelley [name], female    DOB: 06/06/1956, 65 y.o.   MRN: [id number]  Chief Complaint  Patient presents with   Follow-up    Shortness of breath at times.    Referring provider: Andree Moro, DO  HPI: 65 year old female, some day smoker. PMH significant for CAP, environmental/seasonal allergies, alcohol use. Patient of Dr. Vaughan Browner, last seen for initial consult on 06/16/20.   Previous [facility] pulmonary encounter: 06/16/20- Dr. Vaughan Browner, consult 65 year old with history of allergies, hypertension Currently has an interlock device on her car and is unable to blow into it to activate the device She requires spirometry and DMV paperwork to be filled out  She is an active smoker, with chronic dyspnea on exertion Never diagnosed with COPD or asthma.  Not on inhalers  03/25/2021 Patient presents today for dyspnea. She was unable to complete PFTs back in September 2021. Needs spirometry for interlock device. She currently has interlock device on her car and reports that she is unable to blow hard enough to activate device. She has never been diagnosed with asthma or COPD, she has chronic dyspnea but not on inhalers.    Allergies  Allergen Reactions   Metronidazole Nausea And Vomiting   Cephalexin Diarrhea    Developed diarrhea after only 2 doses.   Ciprofloxacin Nausea And Vomiting    Immunization History  Administered Date(s) Administered   PFIZER(Purple Top)SARS-COV-2 Vaccination 06/26/2020, 07/17/2020   PPD Test 03/24/2021    Past Medical History:  Diagnosis Date   Allergy    Environmental and Seasonal:  Pharyngeal drainage, cough and eye symptoms   Elevated blood pressure reading without diagnosis of hypertension 2016   Hyperlipidemia    Hypertension    Tobacco use     Tobacco History: Social History   Tobacco Use  Smoking Status Some Days   Pack years: 0.00   Types: Cigarettes   Start date: 10/09/1990  Smokeless Tobacco Never  Tobacco Comments   1 cigg once  a week   Ready to quit: Not Answered Counseling given: Not Answered Tobacco comments: 1 cigg once a week   Outpatient Medications Prior to Visit  Medication Sig Dispense Refill   amLODipine (NORVASC) 10 MG tablet Take 10 mg by mouth daily. Takes 1/2 tablet daily.     aspirin EC 81 MG tablet Take 81 mg by mouth daily. Swallow whole.     fluticasone (FLONASE) 50 MCG/ACT nasal spray Place 1 spray into both nostrils daily.     meloxicam (MOBIC) 15 MG tablet Take 15 mg by mouth daily. PRN     pantoprazole (PROTONIX) 40 MG tablet Take 1 tablet (40 mg total) by mouth daily. 30 tablet 5   RED YEAST RICE EXTRACT PO Take by mouth. (Patient not taking: Reported on 02/12/2021)     rosuvastatin (CRESTOR) 10 MG tablet Take 10 mg by mouth at bedtime. 1/2 tablet once daily.     No facility-administered medications prior to visit.      Review of Systems  Review of Systems  Constitutional: Negative.   HENT: Negative.    Respiratory:  Positive for shortness of breath.   Cardiovascular: Negative.     Physical Exam  BP 104/60 (BP Location: Left Arm, Cuff Size: Normal)   Pulse 62   LMP  (LMP Unknown)   SpO2 98%  Physical Exam Constitutional:      Appearance: Normal appearance.  HENT:     Head: Normocephalic and atraumatic.  Cardiovascular:  Rate and Rhythm: Normal rate and regular rhythm.  Pulmonary:     Effort: Pulmonary effort is normal.     Breath sounds: Normal breath sounds.  Skin:    General: Skin is warm and dry.  Neurological:     General: No focal deficit present.     Mental Status: She is alert and oriented to person, place, and time. Mental status is at baseline.  Psychiatric:        Mood and Affect: Mood normal.        Behavior: Behavior normal.        Thought Content: Thought content normal.        Judgment: Judgment normal.     Lab Results:  CBC    Component Value Date/Time   WBC 7.5 10/31/2018 1828   RBC 5.00 10/31/2018 1828   HGB 15.7 (H) 10/31/2018 1828    HCT 46.2 (H) 10/31/2018 1828   PLT 498 (H) 10/31/2018 1828   MCV 92.4 10/31/2018 1828   MCH 31.4 10/31/2018 1828   MCHC 34.0 10/31/2018 1828   RDW 13.0 10/31/2018 1828   LYMPHSABS 1.3 10/29/2018 1725   MONOABS 1.2 (H) 10/29/2018 1725   EOSABS 0.2 10/29/2018 1725   BASOSABS 0.1 10/29/2018 1725    BMET    Component Value Date/Time   NA 134 (L) 10/31/2018 1828   K 3.1 (L) 10/31/2018 1828   CL 92 (L) 10/31/2018 1828   CO2 24 10/31/2018 1828   GLUCOSE 121 (H) 10/31/2018 1828   BUN 13 10/31/2018 1828   CREATININE 1.18 (H) 10/31/2018 1828   CALCIUM 8.9 10/31/2018 1828   GFRNONAA 49 (L) 10/31/2018 1828   GFRAA 56 (L) 10/31/2018 1828    BNP No results found for: BNP  ProBNP No results found for: PROBNP  Imaging: No results found.   Assessment & Plan:   Dyspnea - Patient reports that she is unable to use interlock system on her car d/t dyspnea - Spirometry today was normal - Recommend she return for full pulmonary function testing and follow-up with Dr. Ashley Mariner, NP 03/25/2021

## 2021-03-25 NOTE — Assessment & Plan Note (Signed)
-   Patient reports that she is unable to use interlock system on her car d/t dyspnea - Spirometry today was normal - Recommend she return for full pulmonary function testing and follow-up with Dr. Vaughan Browner

## 2021-03-25 NOTE — Patient Instructions (Addendum)
Typically you need two breathing tests by two different providers. We will do a spirometry in office today but you will need to come back for another pulmonary function test  Orders: Pulmonary function testing   Follow-up:  PFTs and follow-up with either Dr. Vaughan Browner, Eric Form, NP or Rexene Edison, NP

## 2021-03-25 NOTE — Addendum Note (Signed)
Addended by: Dessie Coma on: 03/25/2021 01:26 PM   Modules accepted: Orders

## 2021-04-30 ENCOUNTER — Ambulatory Visit: Payer: Medicare Other | Admitting: Pulmonary Disease

## 2021-05-06 ENCOUNTER — Institutional Professional Consult (permissible substitution): Payer: Medicare Other | Admitting: Internal Medicine

## 2021-05-13 NOTE — Progress Notes (Deleted)
$'@Patient'V$  ID: Shelley [name], female    DOB: Feb 03, 1956, 65 y.o.   MRN: [id number]  No chief complaint on file.   Referring provider: Andree Moro, DO  HPI: 65 year old female, some day smoker. PMH significant for CAP, environmental/seasonal allergies, alcohol use. Patient of Dr. Vaughan Browner, last seen for initial consult on 06/16/20.   Previous [facility] pulmonary encounter: 06/16/20- Dr. Vaughan Browner, consult 65 year old with history of allergies, hypertension Currently has an interlock device on her car and is unable to blow into it to activate the device She requires spirometry and DMV paperwork to be filled out  She is an active smoker, with chronic dyspnea on exertion Never diagnosed with COPD or asthma.  Not on inhalers  05/14/2021 Patient presents today for dyspnea. She was unable to complete PFTs back in September 2021. Needs spirometry for interlock device. She currently has interlock device on her car and reports that she is unable to blow hard enough to activate device. She has never been diagnosed with asthma or COPD, she has chronic dyspnea but not on inhalers.    Allergies  Allergen Reactions   Metronidazole Nausea And Vomiting   Cephalexin Diarrhea    Developed diarrhea after only 2 doses.   Ciprofloxacin Nausea And Vomiting    Immunization History  Administered Date(s) Administered   PFIZER(Purple Top)SARS-COV-2 Vaccination 06/26/2020, 07/17/2020   PPD Test 03/24/2021    Past Medical History:  Diagnosis Date   Allergy    Environmental and Seasonal:  Pharyngeal drainage, cough and eye symptoms   Elevated blood pressure reading without diagnosis of hypertension 2016   Hyperlipidemia    Hypertension    Tobacco use     Tobacco History: Social History   Tobacco Use  Smoking Status Some Days   Types: Cigarettes   Start date: 10/09/1990  Smokeless Tobacco Never  Tobacco Comments   1 cigg once a week   Ready to quit: Not Answered Counseling given: Not Answered Tobacco  comments: 1 cigg once a week   Outpatient Medications Prior to Visit  Medication Sig Dispense Refill   amLODipine (NORVASC) 10 MG tablet Take 10 mg by mouth daily. Takes 1/2 tablet daily.     aspirin EC 81 MG tablet Take 81 mg by mouth daily. Swallow whole.     fluticasone (FLONASE) 50 MCG/ACT nasal spray Place 1 spray into both nostrils daily.     meloxicam (MOBIC) 15 MG tablet Take 15 mg by mouth daily. PRN     pantoprazole (PROTONIX) 40 MG tablet Take 1 tablet (40 mg total) by mouth daily. 30 tablet 5   RED YEAST RICE EXTRACT PO Take by mouth. (Patient not taking: Reported on 02/12/2021)     rosuvastatin (CRESTOR) 10 MG tablet Take 10 mg by mouth at bedtime. 1/2 tablet once daily.     No facility-administered medications prior to visit.      Review of Systems  Review of Systems  Constitutional: Negative.   HENT: Negative.    Respiratory:  Positive for shortness of breath.   Cardiovascular: Negative.     Physical Exam  LMP  (LMP Unknown)  Physical Exam Constitutional:      Appearance: Normal appearance.  HENT:     Head: Normocephalic and atraumatic.  Cardiovascular:     Rate and Rhythm: Normal rate and regular rhythm.  Pulmonary:     Effort: Pulmonary effort is normal.     Breath sounds: Normal breath sounds.  Skin:    General: Skin is warm  and dry.  Neurological:     General: No focal deficit present.     Mental Status: She is alert and oriented to person, place, and time. Mental status is at baseline.  Psychiatric:        Mood and Affect: Mood normal.        Behavior: Behavior normal.        Thought Content: Thought content normal.        Judgment: Judgment normal.     Lab Results:  CBC    Component Value Date/Time   WBC 7.5 10/31/2018 1828   RBC 5.00 10/31/2018 1828   HGB 15.7 (H) 10/31/2018 1828   HCT 46.2 (H) 10/31/2018 1828   PLT 498 (H) 10/31/2018 1828   MCV 92.4 10/31/2018 1828   MCH 31.4 10/31/2018 1828   MCHC 34.0 10/31/2018 1828   RDW 13.0  10/31/2018 1828   LYMPHSABS 1.3 10/29/2018 1725   MONOABS 1.2 (H) 10/29/2018 1725   EOSABS 0.2 10/29/2018 1725   BASOSABS 0.1 10/29/2018 1725    BMET    Component Value Date/Time   NA 134 (L) 10/31/2018 1828   K 3.1 (L) 10/31/2018 1828   CL 92 (L) 10/31/2018 1828   CO2 24 10/31/2018 1828   GLUCOSE 121 (H) 10/31/2018 1828   BUN 13 10/31/2018 1828   CREATININE 1.18 (H) 10/31/2018 1828   CALCIUM 8.9 10/31/2018 1828   GFRNONAA 49 (L) 10/31/2018 1828   GFRAA 56 (L) 10/31/2018 1828    BNP No results found for: BNP  ProBNP No results found for: PROBNP  Imaging: No results found.   Assessment & Plan:   No problem-specific Assessment & Plan notes found for this encounter.   Maryjane Hurter, MD 05/13/2021

## 2021-05-14 ENCOUNTER — Ambulatory Visit: Payer: Medicare Other | Admitting: Student

## 2021-09-11 ENCOUNTER — Ambulatory Visit: Payer: Medicare Other | Attending: Internal Medicine | Admitting: Internal Medicine

## 2021-09-11 ENCOUNTER — Encounter: Payer: Self-pay | Admitting: Internal Medicine

## 2021-09-11 ENCOUNTER — Other Ambulatory Visit: Payer: Self-pay

## 2021-09-11 VITALS — BP 138/72 | HR 80 | Resp 16 | Ht 66.5 in | Wt 122.0 lb

## 2021-09-11 DIAGNOSIS — I10 Essential (primary) hypertension: Secondary | ICD-10-CM | POA: Diagnosis not present

## 2021-09-11 DIAGNOSIS — G8929 Other chronic pain: Secondary | ICD-10-CM | POA: Insufficient documentation

## 2021-09-11 DIAGNOSIS — Z7689 Persons encountering health services in other specified circumstances: Secondary | ICD-10-CM

## 2021-09-11 DIAGNOSIS — M25561 Pain in right knee: Secondary | ICD-10-CM

## 2021-09-11 DIAGNOSIS — J989 Respiratory disorder, unspecified: Secondary | ICD-10-CM

## 2021-09-11 DIAGNOSIS — M25562 Pain in left knee: Secondary | ICD-10-CM

## 2021-09-11 DIAGNOSIS — E785 Hyperlipidemia, unspecified: Secondary | ICD-10-CM | POA: Diagnosis not present

## 2021-09-11 DIAGNOSIS — R252 Cramp and spasm: Secondary | ICD-10-CM

## 2021-09-11 DIAGNOSIS — M25551 Pain in right hip: Secondary | ICD-10-CM | POA: Insufficient documentation

## 2021-09-11 DIAGNOSIS — F17210 Nicotine dependence, cigarettes, uncomplicated: Secondary | ICD-10-CM | POA: Insufficient documentation

## 2021-09-11 DIAGNOSIS — M25552 Pain in left hip: Secondary | ICD-10-CM | POA: Insufficient documentation

## 2021-09-11 DIAGNOSIS — Z1159 Encounter for screening for other viral diseases: Secondary | ICD-10-CM

## 2021-09-11 NOTE — Progress Notes (Signed)
Patient ID: LAPORCHA MARCHESI, female    DOB: [birth year]  MRN: [id number]  CC: New Patient (Initial Visit), Hypertension, and URI (Cough, runny nose, fever, congestion/Covid test negative )   Subjective: Shelley [name] is a 65 y.o. female who presents for new patient visit. Her concerns today include:  Patient with history of HTN, HL, tob dep, EtOH use, allergies  Previous PCP was at [facility].  "I did not feel secure in their coverage."  Her neighbor recommended she come her.    ETOH use disorder: drinks 2 shot three times a wk.  Denies drinking heavier in past Smokes 1 cigarette a day and never was a heavy smoker.  Smoked for 10-15 yrs.  Never quit completely but was down to 4 cigarettes/mth.  Wants to quit.    HTN:  takes Norvasc 10 mg daily and just took when she got here Limits salt Checks BP at home 2x/mth.  Last mth SBP was in the 120s but this mth in the 130s  HL:  taking and tolerating Crestor.  Cramps in feet every other night.  Has to get up and walk it off.   C/o cough, rhinorrhea and fever that started 1 wk ago.  Seen at  [place]. 2 days ago.  Flu and COVID test negative. Placed on Z-pack and cough tab.  No SOB.  Feels dizziness sometimes when she stands up.  Drinking more water and juices.  Had 2 COVID vaccines.  No flu and Pneumonia vaccines  On Diclofenac 75 mg BID for neck pain x 1 mth Pain in legs for past 6 mths.  Started 3 mths after having COVID last yr.  Got better with Meloxicam but Mobic caused abdominal pain.  Changed to  Diclofenac and feels this works better for her. Pain and stiffness  in knees, hips but better with diclofenac.  Never had any x-rays of the neck, knees or hips.  She tells me she was never given a formal diagnosis as to what was causing her pain.  HM: last MMG 6 mths ago and it was good.  Up to date with colon CA screen  Past medical, surgical, family history and social history reviewed. Patient Active Problem List   Diagnosis Date  Noted   Dyspnea 03/25/2021   CAP (community acquired pneumonia) 07/25/2018   Tobacco use 07/25/2018   Elevated bilirubin 07/25/2018   Alcohol use 07/25/2018   Environmental and seasonal allergies 10/16/2015     Current Outpatient Medications on File Prior to Visit  Medication Sig Dispense Refill   amLODipine (NORVASC) 10 MG tablet Take 10 mg by mouth daily. Takes 1/2 tablet daily.     aspirin EC 81 MG tablet Take 81 mg by mouth daily. Swallow whole.     fluticasone (FLONASE) 50 MCG/ACT nasal spray Place 1 spray into both nostrils daily.     rosuvastatin (CRESTOR) 10 MG tablet Take 10 mg by mouth at bedtime. 1/2 tablet once daily.     No current facility-administered medications on file prior to visit.    Allergies  Allergen Reactions   Meloxicam     Stomach pain   Metronidazole Nausea And Vomiting   Cephalexin Diarrhea    Developed diarrhea after only 2 doses.   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  Looking to get employment with childcare.   Occupation: Psychologist, occupational at a day care    Comment: Retired Astronomer  Tobacco Use   Smoking status: Some Days    Types: Cigarettes    Start date: 10/09/1990   Smokeless tobacco: Never   Tobacco comments:    1 cigg once a week  Vaping Use   Vaping Use: Never used  Substance and Sexual Activity   Alcohol use: Yes    Alcohol/week: 6.0 standard drinks    Types: 6 Shots of liquor per week   Drug use: No   Sexual activity: Yes    Birth control/protection: None  Other Topics Concern   Not on file  Social History Narrative   Born and raised in [place].   Went to school for speech pathology and communication--spent time at [employer] and [employer]--2  years of college   Lives with 2 daughters, ages 31 and 77 yo.   Social Determinants of Health   Financial Resource Strain: Not on file  Food Insecurity: Not on file  Transportation Needs: Not on file  Physical Activity: Not on file  Stress: Not on file  Social Connections: Not on file  Intimate Partner Violence: Not on file    Family History  Problem Relation Age of Onset   Heart disease Mother    Hypertension Mother    Congestive Heart Failure Mother        Cause of death   Congestive Heart Failure Father    Stroke Father        Cause of death   Hypertension Father    Alcohol abuse Brother    Hypertension Brother    Heart disease Maternal Grandmother    Stroke Maternal Grandmother    Diabetes Sister    Hypertension Sister    Heart disease Sister        Arrhythmia and myocarditis/Congenital Heart Disease/possible arrhythmia   Diabetes Paternal Grandmother    Diabetes Paternal Grandfather    Colitis Neg Hx    Esophageal cancer Neg Hx    Stomach cancer Neg Hx    Rectal cancer Neg Hx     Past Surgical History:  Procedure Laterality Date   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    ROS: Review of Systems Negative except as stated above  PHYSICAL EXAM: BP 138/72   Pulse 80   Resp 16   Ht 5' 6.5" (1.689 m)   Wt 122 lb (55.3 kg)   LMP  (LMP Unknown)   SpO2 100%   BMI 19.40 kg/m   Physical Exam Repeat BP 149/71, P 66 Standing: 153/75, P 70 General appearance - alert, well appearing, older African-American female and in no distress Mental status - normal mood, behavior, speech, dress, motor activity, and thought processes Mouth - mucous membranes moist, pharynx normal without lesions Neck - supple, no significant adenopathy Chest - clear to auscultation, no wheezes, rales or rhonchi, symmetric air entry Heart - normal rate, regular rhythm, normal S1, S2, no murmurs, rubs, clicks or gallops Musculoskeletal -knees: Joints slightly  enlarged.  No point tenderness.  Good range of motion.  Hips: No tenderness over the trochanteric bursa on either side.  She has mild discomfort with passive rotation of both hips. Extremities - peripheral pulses normal, no pedal edema, no clubbing or  cyanosis   CMP Latest Ref Rng & Units 10/31/2018 10/29/2018 10/22/2018  Glucose 70 - 99 mg/dL 121(H) 100(H) 107(H)  BUN 8 - 23 mg/dL 13 8 8   Creatinine 0.44 - 1.00 mg/dL 1.18(H) 0.83 0.73  Sodium 135 - 145 mmol/L 134(L) 135 138  Potassium 3.5 - 5.1 mmol/L 3.1(L) 3.8 3.8  Chloride 98 - 111 mmol/L 92(L) 92(L) 104  CO2 22 - 32 mmol/L 24 27 23   Calcium 8.9 - 10.3 mg/dL 8.9 8.9 8.9  Total Protein 6.5 - 8.1 g/dL 7.5 7.1 6.7  Total Bilirubin 0.3 - 1.2 mg/dL 0.7 1.1 0.6  Alkaline Phos 38 - 126 U/L 94 89 74  AST 15 - 41 U/L 17 31 17   ALT 0 - 44 U/L 24 25 15    Lipid Panel  No results found for: CHOL, TRIG, HDL, CHOLHDL, VLDL, LDLCALC, LDLDIRECT  CBC    Component Value Date/Time   WBC 7.5 10/31/2018 1828   RBC 5.00 10/31/2018 1828   HGB 15.7 (H) 10/31/2018 1828   HCT 46.2 (H) 10/31/2018 1828   PLT 498 (H) 10/31/2018 1828   MCV 92.4 10/31/2018 1828   MCH 31.4 10/31/2018 1828   MCHC 34.0 10/31/2018 1828   RDW 13.0 10/31/2018 1828   LYMPHSABS 1.3 10/29/2018 1725   MONOABS 1.2 (H) 10/29/2018 1725   EOSABS 0.2 10/29/2018 1725   BASOSABS 0.1 10/29/2018 1725    ASSESSMENT AND PLAN: 1. Encounter to establish care   2. Essential hypertension Not at goal.  We discussed adding another medication versus having her see our clinical pharmacist in several weeks for recheck.  She prefers the latter.  DASH diet encouraged. - CBC with Differential/Platelet - Comprehensive metabolic panel - Lipid panel  3. Light smoker Discussed health risks associated with smoking.  Patient agrees to quit and will do so cold [place].  4. Hyperlipidemia, unspecified hyperlipidemia type Continue Crestor.  However advised that she takes it 3 to 4 days a week instead  of every night because I think it is causing the nocturnal cramps.  5. Muscle cramp, nocturnal See #4 above  6. Hip pain, bilateral Likely osteoarthritis.  We will get some baseline x-rays.  Diclofenac may be contributing to her blood pressure.  She will hold off on taking it and try extra strength Tylenol instead. - DG Hip Unilat W OR W/O Pelvis Min 4 Views Right; Future - DG Hip Unilat W OR W/O Pelvis Min 4 Views Left; Future  7. Chronic pain of both knees See #6 above. - DG Knee Complete 4 Views Left; Future - DG Knee Complete 4 Views Right; Future  8. Respiratory illness Resolving.  9. Need for hepatitis C screening test Patient agrees to screening. - HCV Ab w Reflex to Quant PCR  In regards to alcohol consumption, I recommend that she cut back to no more than 1 standard drink per night and I went over with her what is a standard drink.  Patient was given the opportunity to ask questions.  Patient verbalized understanding of the plan and was able to repeat key elements of the plan.   Orders Placed This Encounter  Procedures   DG Knee Complete 4 Views Left   DG Knee Complete 4 Views Right   DG Hip Unilat W OR W/O Pelvis Min 4 Views Right   DG Hip Unilat W OR W/O Pelvis Min 4 Views Left   HCV Ab w Reflex to Quant PCR   CBC with Differential/Platelet   Comprehensive metabolic panel  Lipid panel     Requested Prescriptions    No prescriptions requested or ordered in this encounter    Return in about 3 months (around 12/10/2021) for Sign release to get records from [facility]. Luke in 3 wks for [employer], flu and Pneumonia vacc.  Karle Plumber, MD, FACP

## 2021-09-11 NOTE — Patient Instructions (Signed)
Try taking your cholesterol medicine the rosuvastatin 3 times a week instead of every day to see if the cramps in your feet decreased.  Hold the diclofenac's.  Try taking extra strength Tylenol 1 tablet 2-3 times a day as needed for joint pains.  Please keep a log of your blood pressure readings and bring them with you on follow-up visit in 3 weeks to see our clinical pharmacist.  We will plan to give you the flu and pneumonia vaccine on that visit.

## 2021-09-12 LAB — LIPID PANEL
Chol/HDL Ratio: 2.6 ratio (ref 0.0–4.4)
Cholesterol, Total: 156 mg/dL (ref 100–199)
HDL: 60 mg/dL (ref >39)
LDL Chol Calc (NIH): 77 mg/dL (ref 0–99)
Triglycerides: 102 mg/dL (ref 0–149)
VLDL Cholesterol Cal: 19 mg/dL (ref 5–40)

## 2021-09-12 LAB — CBC WITH DIFFERENTIAL/PLATELET
Basophils Absolute: 0.1 10*3/uL (ref 0.0–0.2)
Basos: 1 %
EOS (ABSOLUTE): 0.2 10*3/uL (ref 0.0–0.4)
Eos: 2 %
Hematocrit: 40.2 % (ref 34.0–46.6)
Hemoglobin: 14.1 g/dL (ref 11.1–15.9)
Immature Grans (Abs): 0 10*3/uL (ref 0.0–0.1)
Immature Granulocytes: 0 %
Lymphocytes Absolute: 2.2 10*3/uL (ref 0.7–3.1)
Lymphs: 27 %
MCH: 32 pg (ref 26.6–33.0)
MCHC: 35.1 g/dL (ref 31.5–35.7)
MCV: 91 fL (ref 79–97)
Monocytes Absolute: 0.7 10*3/uL (ref 0.1–0.9)
Monocytes: 9 %
Neutrophils Absolute: 4.9 10*3/uL (ref 1.4–7.0)
Neutrophils: 61 %
Platelets: 290 10*3/uL (ref 150–450)
RBC: 4.4 x10E6/uL (ref 3.77–5.28)
RDW: 13.2 % (ref 11.7–15.4)
WBC: 8.1 10*3/uL (ref 3.4–10.8)

## 2021-09-12 LAB — COMPREHENSIVE METABOLIC PANEL WITH GFR
ALT: 27 [IU]/L (ref 0–32)
AST: 23 [IU]/L (ref 0–40)
Albumin/Globulin Ratio: 1.7 (ref 1.2–2.2)
Albumin: 5.1 g/dL — ABNORMAL HIGH (ref 3.8–4.8)
Alkaline Phosphatase: 98 [IU]/L (ref 44–121)
BUN/Creatinine Ratio: 16 (ref 12–28)
BUN: 11 mg/dL (ref 8–27)
Bilirubin Total: 0.4 mg/dL (ref 0.0–1.2)
CO2: 25 mmol/L (ref 20–29)
Calcium: 10.4 mg/dL — ABNORMAL HIGH (ref 8.7–10.3)
Chloride: 101 mmol/L (ref 96–106)
Creatinine, Ser: 0.69 mg/dL (ref 0.57–1.00)
Globulin, Total: 3 g/dL (ref 1.5–4.5)
Glucose: 93 mg/dL (ref 70–99)
Potassium: 5.2 mmol/L (ref 3.5–5.2)
Sodium: 142 mmol/L (ref 134–144)
Total Protein: 8.1 g/dL (ref 6.0–8.5)
eGFR: 96 mL/min/{1.73_m2}

## 2021-09-12 LAB — HCV INTERPRETATION

## 2021-09-12 LAB — HCV AB W REFLEX TO QUANT PCR: HCV Ab: <0.1 {s_co_ratio} (ref 0.0–0.9)

## 2021-09-12 NOTE — Progress Notes (Signed)
Screen for hepatitis C is negative. Blood cell counts are normal. Kidney and liver function test normal. Cholesterol levels normal.

## 2021-09-14 ENCOUNTER — Telehealth: Payer: Self-pay

## 2021-09-14 NOTE — Telephone Encounter (Signed)
Contacted pt to go over lab results pt didn't answer lvm   Sent a CRM and forward labs to NT to give pt labs when they call back   

## 2021-10-11 NOTE — Progress Notes (Signed)
° °  S:    PCP: Dr. Wynetta Emery  Patient presents to the clinic for hypertension evaluation, counseling, and management. Patient was referred and last seen by Primary Care Provider on 09/11/21 when BP elevated to 138/72. Patient preferred recheck by CPP rather than adding another medication.   Today, patient arrives in good spirits. Denies headaches, dizziness, swelling, blurred vision. She reports her BP used to be well controlled until her pain medication was switched to diclofenac. This was stopped at her last visit and she hasn't been taking except today she was in some pain and took it today.   Medication adherence: may have missed 1 dose of amlodipine in the past week. Has taken her medication today.   Current BP Medications include:  amlodipine 10 mg daily  Antihypertensives tried in the past include: none  Dietary habits include: trying to reduce salt, has eaten more sodium containing foods lately because of her birthday and the holidays Exercise habits include: stays active  Family / Social history: smoking 1 cigarette per day, trying to decrease alcohol intake too. HTN in both parents  O:  Home BP readings: hasn't checked lately because of the holidays  Last 3 Office BP readings: BP Readings from Last 3 Encounters:  10/12/21 (!) 144/77  09/11/21 138/72  03/25/21 104/60   BMET    Component Value Date/Time   NA 142 09/11/2021 1017   K 5.2 09/11/2021 1017   CL 101 09/11/2021 1017   CO2 25 09/11/2021 1017   GLUCOSE 93 09/11/2021 1017   GLUCOSE 121 (H) 10/31/2018 1828   BUN 11 09/11/2021 1017   CREATININE 0.69 09/11/2021 1017   CALCIUM 10.4 (H) 09/11/2021 1017   GFRNONAA 49 (L) 10/31/2018 1828   GFRAA 56 (L) 10/31/2018 1828   Renal function: CrCl cannot be calculated (Patient's most recent lab result is older than the maximum 21 days allowed.).  Clinical ASCVD: No  The 10-year ASCVD risk score (Arnett DK, et al., 2019) is: 19.7%   Values used to calculate the score:      Age: 66 years     Sex: Female     Is Non-Hispanic African American: Yes     Diabetic: No     Tobacco smoker: Yes     Systolic Blood Pressure: 967 mmHg     Is BP treated: Yes     HDL Cholesterol: 60 mg/dL     Total Cholesterol: 156 mg/dL   A/P: Hypertension diagnosed currently above goal on current medications. BP Goal = < 130/80 mmHg. Medication adherence reported as good. She is open to adding another BP medication today. Will avoid adding a thiazide due to hypercalcemia on recent lab work. Will add low dose ARB instead and check BMET at next visit to confirm potassium stays wnl.  -Start valsartan 40 mg daily.  -Continue amlodipine 10 mg daily.   -F/u labs ordered - BMET in 1-2 weeks -Counseled on lifestyle modifications for blood pressure control including reduced dietary sodium, increased exercise, adequate sleep. -Instructed patient to check her BP daily and bring log to next visit.   Total time in face-to-face counseling 20 minutes. F/U Clinic Visit in 1-2 weeks for labs only (BMET) and then CPP visit in 3 weeks.   Rebbeca Paul, PharmD PGY2 Ambulatory Care Pharmacy Resident 10/12/2021 3:50 PM

## 2021-10-12 ENCOUNTER — Ambulatory Visit: Payer: Commercial Managed Care - HMO | Attending: Internal Medicine | Admitting: Pharmacist

## 2021-10-12 ENCOUNTER — Other Ambulatory Visit: Payer: Self-pay

## 2021-10-12 VITALS — BP 144/77 | HR 83

## 2021-10-12 DIAGNOSIS — I10 Essential (primary) hypertension: Secondary | ICD-10-CM | POA: Diagnosis not present

## 2021-10-12 MED ORDER — VALSARTAN 40 MG PO TABS: 40.0000 mg | ORAL_TABLET | Freq: Every day | ORAL | 3 refills | Status: DC

## 2021-10-21 ENCOUNTER — Other Ambulatory Visit: Payer: Self-pay | Admitting: Registered Nurse

## 2021-10-21 DIAGNOSIS — E2839 Other primary ovarian failure: Secondary | ICD-10-CM

## 2021-10-22 ENCOUNTER — Other Ambulatory Visit: Payer: Commercial Managed Care - HMO

## 2021-10-29 NOTE — Progress Notes (Signed)
° °  S:    PCP: Dr. Wynetta Emery  Patient presents to the clinic for hypertension evaluation, counseling, and management. Patient was referred and last seen by Primary Care Provider on 09/11/21 when BP elevated to 138/72. At last PCP visit, BP was 144/77 and valsartan was added. BMET scheduled but patient canceled lab appt.   Today, patient arrives in good spirits. Denies headaches, dizziness, swelling, blurred vision. She thought she was supposed to switch from amlodipine to valsartan so when she was only taking valsartan she had tingling in her fingers. When she went back to taking amlodipine this went away. She has not been taking rosuvastatin due to concerns of leg pain with this.   Medication adherence reported.   Current BP Medications include:  amlodipine 10 mg daily (takes at night), valsartan 40 mg daily (not taking)  Dietary habits include: trying to reduce salt, eating fried fish Exercise habits include: stays active  Family / Social history: smoking 1 cigarette per day, trying to decrease alcohol intake too. HTN in both parents  O:  There were no vitals filed for this visit.  Last 3 Office BP readings: BP Readings from Last 3 Encounters:  10/12/21 (!) 144/77  09/11/21 138/72  03/25/21 104/60   BMET    Component Value Date/Time   NA 142 09/11/2021 1017   K 5.2 09/11/2021 1017   CL 101 09/11/2021 1017   CO2 25 09/11/2021 1017   GLUCOSE 93 09/11/2021 1017   GLUCOSE 121 (H) 10/31/2018 1828   BUN 11 09/11/2021 1017   CREATININE 0.69 09/11/2021 1017   CALCIUM 10.4 (H) 09/11/2021 1017   GFRNONAA 49 (L) 10/31/2018 1828   GFRAA 56 (L) 10/31/2018 1828   Renal function: CrCl cannot be calculated (Patient's most recent lab result is older than the maximum 21 days allowed.).  Clinical ASCVD: No  The 10-year ASCVD risk score (Arnett DK, et al., 2019) is: 19.7%   Values used to calculate the score:     Age: 30 years     Sex: Female     Is Non-Hispanic African American: Yes      Diabetic: No     Tobacco smoker: Yes     Systolic Blood Pressure: 032 mmHg     Is BP treated: Yes     HDL Cholesterol: 60 mg/dL     Total Cholesterol: 156 mg/dL   A/P: Hypertension diagnosed currently above goal on current medications. BP Goal = < 130/80 mmHg. She has not been taking valsartan due to confusion about regimen. Will resume valsartan and check BP and BMET at next visit.  -Start valsartan 40 mg daily.  -Continue amlodipine 10 mg daily.   -F/u labs ordered - BMET at next visit -Counseled on lifestyle modifications for blood pressure control including reduced dietary sodium, increased exercise, adequate sleep. Need to decrease fried foods.  -Instructed patient to check her BP daily and bring log to next visit.   Patient has not been taking rosuvastatin due to myalgias. Will decrease rosuvastatin to 5 mg daily which she is agreeable to.   Total time in face-to-face counseling 20 minutes. F/U CPP visit in 3 weeks.   Rebbeca Paul, PharmD PGY2 Ambulatory Care Pharmacy Resident 11/02/2021 4:11 PM

## 2021-11-02 ENCOUNTER — Telehealth: Payer: Self-pay

## 2021-11-02 ENCOUNTER — Ambulatory Visit: Payer: Medicare Other | Attending: Internal Medicine | Admitting: Pharmacist

## 2021-11-02 ENCOUNTER — Encounter: Payer: Self-pay | Admitting: Pharmacist

## 2021-11-02 ENCOUNTER — Other Ambulatory Visit: Payer: Self-pay

## 2021-11-02 DIAGNOSIS — I10 Essential (primary) hypertension: Secondary | ICD-10-CM

## 2021-11-02 MED ORDER — ROSUVASTATIN CALCIUM 5 MG PO TABS: 5.0000 mg | ORAL_TABLET | Freq: Every day | ORAL | 3 refills | Status: DC

## 2021-11-02 NOTE — Telephone Encounter (Signed)
Patient dropped off forms, requesting a call once they have been filled out. Aware it will take 7-10 business days.

## 2021-11-03 ENCOUNTER — Ambulatory Visit: Payer: Medicare Other | Attending: Internal Medicine

## 2021-11-03 DIAGNOSIS — I10 Essential (primary) hypertension: Secondary | ICD-10-CM

## 2021-11-03 NOTE — Telephone Encounter (Signed)
Located form and will be placed in pcp folder

## 2021-11-04 LAB — BASIC METABOLIC PANEL
BUN/Creatinine Ratio: 27 (ref 12–28)
BUN: 21 mg/dL (ref 8–27)
CO2: 25 mmol/L (ref 20–29)
Calcium: 9.5 mg/dL (ref 8.7–10.3)
Chloride: 104 mmol/L (ref 96–106)
Creatinine, Ser: 0.78 mg/dL (ref 0.57–1.00)
Glucose: 144 mg/dL — ABNORMAL HIGH (ref 70–99)
Potassium: 4.3 mmol/L (ref 3.5–5.2)
Sodium: 142 mmol/L (ref 134–144)
eGFR: 83 mL/min/{1.73_m2} (ref >59)

## 2021-11-04 NOTE — Progress Notes (Signed)
Let patient know that her kidney function is okay.  Sodium and calcium level normal.  Blood sugar mildly elevated.  We will screen for diabetes on her visit with me next month.

## 2021-11-05 NOTE — Telephone Encounter (Signed)
Contacted pt and scheduled an 810 virtual

## 2021-11-06 ENCOUNTER — Other Ambulatory Visit: Payer: Self-pay

## 2021-11-06 ENCOUNTER — Ambulatory Visit: Payer: Medicare Other | Attending: Internal Medicine | Admitting: Internal Medicine

## 2021-11-06 DIAGNOSIS — Z0289 Encounter for other administrative examinations: Secondary | ICD-10-CM

## 2021-11-06 NOTE — Progress Notes (Signed)
Patient ID: Shelley [name], female   DOB: 1956/02/14, 66 y.o.   MRN: [id number] Virtual Visit via Telephone Note  I connected with Shelley [name] on 11/06/2021 at 8:21 a.m by telephone and verified that I am speaking with the correct person using two identifiers  Location: Patient: home Provider: office  Participants: Myself Patient   I discussed the limitations, risks, security and privacy concerns of performing an evaluation and management service by telephone and the availability of in person appointments. I also discussed with the patient that there may be a patient responsible charge related to this service. The patient expressed understanding and agreed to proceed.   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  Her job function is planning social cognitive and physical activities for the children throughout the day.  She works 6 hours a day with children ages 82 to 71 years of age. On last visit she reported some joint aches.  I had ordered x-rays of the knees and hips.  She has not had those done as yet.  Patient states she takes Tylenol with good results.  She does not feel that this would limit her physically in any way for working.  She has cut back on smoking to 3 cigarettes a week.  Still does 2 shots every other day.  Denies any significant issues with depression or anxiety.  She is not on any psychiatric medications.  Patient feels that she is emotionally and physically fit to do the job. Outpatient Encounter Medications as of 11/06/2021  Medication Sig   amLODipine (NORVASC) 10 MG tablet Take 10 mg by mouth daily. Takes 1/2 tablet daily.   aspirin EC 81 MG tablet Take 81 mg by mouth daily. Swallow whole.   fluticasone (FLONASE) 50 MCG/ACT nasal spray Place 1 spray into both  nostrils daily.   rosuvastatin (CRESTOR) 5 MG tablet Take 1 tablet (5 mg total) by mouth daily.   valsartan (DIOVAN) 40 MG tablet Take 1 tablet (40 mg total) by mouth daily.   No facility-administered encounter medications on file as of 11/06/2021.      Observations/Objective: No direct observation done as this was a telephone encounter.  Assessment and Plan: Advised patient that form will be completed.  I see no major issues with her working with children.  I told her how to get the x-rays done that I ordered.  Commended her on cutting back on the amount that she smokes.  I again went over with her how much alcoholic beverages recommended per day for  females which is no more than 1 standard drink per day.  Follow Up Instructions: As previously scheduled.   I discussed the assessment and treatment plan with the patient. The patient was provided an opportunity to ask questions and all were answered. The patient agreed with the plan and demonstrated an understanding of the instructions.   The patient was advised to call back or seek an in-person evaluation if the symptoms worsen or if the condition fails to improve as anticipated.  I  Spent 6 minutes on this telephone encounter  This note has been created with Surveyor, quantity. Any transcriptional errors are unintentional.  Karle Plumber, MD

## 2021-11-09 ENCOUNTER — Ambulatory Visit: Payer: Self-pay | Admitting: *Deleted

## 2021-11-09 ENCOUNTER — Telehealth: Payer: Self-pay

## 2021-11-09 NOTE — Telephone Encounter (Signed)
Contacted pt to go over lab results pt didn't answer lvm   Sent a CRM and forward labs to NT to give pt labs when they call back   

## 2021-11-09 NOTE — Telephone Encounter (Signed)
Pt given lab results per notes of Dr. Wynetta Emery from 11/04/21 on 11/09/21. Pt verbalized understanding.

## 2021-11-09 NOTE — Telephone Encounter (Signed)
Pt given lab results per notes of Dr. Wynetta Emery from 11/04/21  on 11/09/21. Pt verbalized understanding.

## 2021-11-21 NOTE — Progress Notes (Signed)
° °  S:    PCP: Dr. Wynetta Emery  Patient presents to the clinic for hypertension evaluation, counseling, and management. Patient was referred and last seen by Primary Care Provider on 09/11/21 when BP elevated to 138/72. At 10/12/21 CPP visit, BP was 144/77 and valsartan was added, however at last visit on 11/02/21, pt mistakenly discontinued amlodipine then switched herself back from valsartan to amlodipine. Valsartan was resumed at that visit. Pt also reported not taking rosuvastatin due to myalgias but was willing to retry at a lower dose so rosuvastatin 5 mg was started.   Today, patient arrives in good spirits. Denies headaches, dizziness, swelling, blurred vision. She has been taking both amlodipine and valsartan and denies any adverse effects. Reports some muscle pain since restarting rosuvastatin so she has been taking it once a week.   Medication adherence reported. She has taken them today.   Current BP Medications include:  amlodipine 10 mg daily, valsartan 40 mg daily  Dietary habits include: trying to reduce salt, eating fried fish Exercise habits include: stays active  Family / Social history: smoking 1 cigarette per day, trying to decrease alcohol intake too. HTN in both parents  O:  Vitals:   11/23/21 1557  BP: 128/75  Pulse: 71    Last 3 Office BP readings: BP Readings from Last 3 Encounters:  11/23/21 128/75  10/12/21 (!) 144/77  09/11/21 138/72   BMET    Component Value Date/Time   NA 142 11/03/2021 1548   K 4.3 11/03/2021 1548   CL 104 11/03/2021 1548   CO2 25 11/03/2021 1548   GLUCOSE 144 (H) 11/03/2021 1548   GLUCOSE 121 (H) 10/31/2018 1828   BUN 21 11/03/2021 1548   CREATININE 0.78 11/03/2021 1548   CALCIUM 9.5 11/03/2021 1548   GFRNONAA 49 (L) 10/31/2018 1828   GFRAA 56 (L) 10/31/2018 1828   Renal function: CrCl cannot be calculated (Unknown ideal weight.).  Clinical ASCVD: No  The 10-year ASCVD risk score (Arnett DK, et al., 2019) is: 15.5%   Values  used to calculate the score:     Age: 14 years     Sex: Female     Is Non-Hispanic African American: Yes     Diabetic: No     Tobacco smoker: Yes     Systolic Blood Pressure: 962 mmHg     Is BP treated: Yes     HDL Cholesterol: 60 mg/dL     Total Cholesterol: 156 mg/dL   A/P: Hypertension diagnosed now at goal on current medications after addition of valsartan. BP Goal = < 130/80 mmHg. -Continue valsartan 40 mg daily and amlodipine 10 mg daily.   -F/u labs ordered - BMET today -Counseled on lifestyle modifications for blood pressure control including reduced dietary sodium, increased exercise, adequate sleep.  -Instructed patient to check her BP daily and bring log to next visit.   Patient was previously not taking rosuvastatin due to myalgias. Sent in decreased dose of 5 mg daily which she started but had some myalgias. She has decreased to taking it once weekly. Encouraged her to try to increase to twice weekly but encouraged her to continue to at least take it once a week rather than stopping altogether.   Total time in face-to-face counseling 20 minutes. F/U PCP visit 12/10/21.   Rebbeca Paul, PharmD PGY2 Ambulatory Care Pharmacy Resident 11/23/2021 3:58 PM

## 2021-11-23 ENCOUNTER — Other Ambulatory Visit: Payer: Self-pay

## 2021-11-23 ENCOUNTER — Ambulatory Visit: Payer: Medicare Other | Attending: Internal Medicine | Admitting: Pharmacist

## 2021-11-23 VITALS — BP 128/75 | HR 71

## 2021-11-23 DIAGNOSIS — I10 Essential (primary) hypertension: Secondary | ICD-10-CM

## 2021-11-23 MED ORDER — AMLODIPINE BESYLATE 10 MG PO TABS: 10.0000 mg | ORAL_TABLET | Freq: Every day | ORAL | 1 refills | Status: DC

## 2021-11-24 LAB — BASIC METABOLIC PANEL
BUN/Creatinine Ratio: 37 — ABNORMAL HIGH (ref 12–28)
BUN: 19 mg/dL (ref 8–27)
CO2: 26 mmol/L (ref 20–29)
Calcium: 9.6 mg/dL (ref 8.7–10.3)
Chloride: 105 mmol/L (ref 96–106)
Creatinine, Ser: 0.51 mg/dL — ABNORMAL LOW (ref 0.57–1.00)
Glucose: 113 mg/dL — ABNORMAL HIGH (ref 70–99)
Potassium: 4.2 mmol/L (ref 3.5–5.2)
Sodium: 142 mmol/L (ref 134–144)
eGFR: 102 mL/min/{1.73_m2} (ref >59)

## 2021-11-26 ENCOUNTER — Telehealth: Payer: Self-pay

## 2021-11-26 NOTE — Telephone Encounter (Signed)
Contacted pt to go over lab results pt didn't answer lvm   Sent a CRM and forward labs to NT to give pt labs when they call back   

## 2021-12-02 ENCOUNTER — Ambulatory Visit (HOSPITAL_COMMUNITY)
Admission: RE | Admit: 2021-12-02 | Discharge: 2021-12-02 | Disposition: A | Discharge status: Home / Self Care | Payer: Medicare Other | Source: Ambulatory Visit | Attending: Internal Medicine | Admitting: Internal Medicine

## 2021-12-02 ENCOUNTER — Other Ambulatory Visit: Payer: Self-pay | Admitting: Internal Medicine

## 2021-12-02 ENCOUNTER — Other Ambulatory Visit: Payer: Self-pay

## 2021-12-02 DIAGNOSIS — M25551 Pain in right hip: Secondary | ICD-10-CM

## 2021-12-02 DIAGNOSIS — M25552 Pain in left hip: Secondary | ICD-10-CM | POA: Diagnosis present

## 2021-12-02 DIAGNOSIS — M25562 Pain in left knee: Secondary | ICD-10-CM | POA: Insufficient documentation

## 2021-12-02 DIAGNOSIS — G8929 Other chronic pain: Secondary | ICD-10-CM

## 2021-12-02 DIAGNOSIS — M25561 Pain in right knee: Secondary | ICD-10-CM | POA: Diagnosis present

## 2021-12-04 NOTE — Progress Notes (Signed)
Let patient know that x-rays showed moderate to severe arthritis of the right hip, moderate arthritis of the left hip, mild arthritis in both knees.  Let me know if she would like for me to refer her to orthopedics.

## 2021-12-10 ENCOUNTER — Ambulatory Visit: Payer: Medicare Other | Admitting: Internal Medicine

## 2022-02-10 ENCOUNTER — Other Ambulatory Visit: Payer: Self-pay | Admitting: Internal Medicine

## 2022-02-10 NOTE — Telephone Encounter (Signed)
Called and S/w pharmacy - pt has 90 day refill available.  ?Medication will be refilled. ?

## 2022-02-10 NOTE — Telephone Encounter (Signed)
Pt has 90 day refill available - Medication will be refilled. ?Requested Prescriptions  ?Pending Prescriptions Disp Refills  ?? amLODipine (NORVASC) 10 MG tablet 90 tablet 1  ?  Sig: Take 1 tablet (10 mg total) by mouth daily.  ?  ? Cardiovascular: Calcium Channel Blockers 2 Passed - 02/10/2022  1:44 PM  ?  ?  Passed - Last BP in normal range  ?  BP Readings from Last 1 Encounters:  ?11/23/21 128/75  ?   ?  ?  Passed - Last Heart Rate in normal range  ?  Pulse Readings from Last 1 Encounters:  ?11/23/21 71  ?   ?  ?  Passed - Valid encounter within last 6 months  ?  Recent Outpatient Visits   ?      ? 2 months ago Essential hypertension  ? [place], RPH-CPP  ? 3 months ago Encounter for completion of form with patient  ? [place] Ladell Pier, MD  ? 3 months ago Essential hypertension  ? [place], RPH-CPP  ? 4 months ago Essential hypertension  ? [place], RPH-CPP  ? 5 months ago Encounter to establish care  ? [place] Ladell Pier, MD  ?  ?  ?Future Appointments   ?        ? In 1 week Ladell Pier, MD [place]  ?  ? ?  ?  ?  ? ?

## 2022-02-10 NOTE — Telephone Encounter (Signed)
Medication Refill - Medication: amLODipine (NORVASC) 10 MG tablet  ? ?Has the patient contacted their pharmacy? Yes.   ?(Agent: If no, request that the patient contact the pharmacy for the refill. If patient does not wish to contact the pharmacy document the reason why and proceed with request.) ?(Agent: If yes, when and what did the pharmacy advise?) ? ?Preferred Pharmacy (with phone number or street name):  ?[facility]  ?[place] [address]  ?Phone: [phone] Fax: [phone]  ? ?Has the patient been seen for an appointment in the last year OR does the patient have an upcoming appointment? Yes.   ? ?Agent: Please be advised that RX refills may take up to 3 business days. We ask that you follow-up with your pharmacy. ? ?

## 2022-02-19 ENCOUNTER — Encounter: Payer: Self-pay | Admitting: Internal Medicine

## 2022-02-19 ENCOUNTER — Ambulatory Visit: Payer: Medicare Other | Attending: Internal Medicine | Admitting: Internal Medicine

## 2022-02-19 VITALS — BP 134/72 | HR 79 | Wt 119.4 lb

## 2022-02-19 DIAGNOSIS — Z23 Encounter for immunization: Secondary | ICD-10-CM | POA: Diagnosis not present

## 2022-02-19 DIAGNOSIS — F17210 Nicotine dependence, cigarettes, uncomplicated: Secondary | ICD-10-CM | POA: Diagnosis not present

## 2022-02-19 DIAGNOSIS — I10 Essential (primary) hypertension: Secondary | ICD-10-CM

## 2022-02-19 DIAGNOSIS — E785 Hyperlipidemia, unspecified: Secondary | ICD-10-CM

## 2022-02-19 DIAGNOSIS — M160 Bilateral primary osteoarthritis of hip: Secondary | ICD-10-CM | POA: Diagnosis not present

## 2022-02-19 DIAGNOSIS — F17200 Nicotine dependence, unspecified, uncomplicated: Secondary | ICD-10-CM

## 2022-02-19 MED ORDER — TRAMADOL HCL 50 MG PO TABS: 50.0000 mg | ORAL_TABLET | Freq: Two times a day (BID) | ORAL | 0 refills | Status: DC | PRN

## 2022-02-19 MED ORDER — CELECOXIB 200 MG PO CAPS: 200.0000 mg | ORAL_CAPSULE | Freq: Every day | ORAL | 2 refills | Status: DC

## 2022-02-19 MED ORDER — OMEPRAZOLE 20 MG PO CPDR: 20.0000 mg | DELAYED_RELEASE_CAPSULE | Freq: Every day | ORAL | 3 refills | Status: DC

## 2022-02-19 NOTE — Progress Notes (Signed)
Patient ID: Shelley [name], female    DOB: 03/17/1956  MRN: [id number]  CC: Leg Pain   Subjective: Shelley [name] is a 66 y.o. female who presents for right hip pain and chronic ds management Her concerns today include:  Patient with history of HTN, HL, tob dep, EtOH use, allergies  Took a few days off from work due to pain in RT hip and feeling off balance with it. Feels like "the jt is out of socket."  No falls.  Has chronic pain in both hips but Rt flared recently.  Pain is constant but reports she has gotten use to it some.  Walking helps some.  Worse with position changes. Pain wakes her at nights about every 2 hrs.  Putting a pillow b/w legs help.  Endorses stiffness in hips when she first stands up.  Recent  x-rays showed moderate to severe arthritis of the right hip, moderate arthritis of the left hip, mild arthritis in both knees. Out of Diclofenac.  When I first met her 09/2022 she indicated she was taking Diclofenac 75 mg BID that worked better than Mobic.  HTN:  taking and tolerating Norvasc 10 mg and Valsartan 40.  Reports she takes Diovan only 3 times a wk but Norvasc daily. Taking the Diovan 3 times a wk because she was afraid it would hurt her kidneys. Checks BP once a wk.  Reports readings are good as long as she stay off salt .    HL:  takes Crestor 3 times a wk.  Noticed dec cramping in legs with 3x/wk dosing instead of daily dosing  Tob dep:  on last visit she was down to 1 cigarette a day. Had said she was doing to quit cold [place]. Now smoking 1/2 of of one cigarette a day.    Drinking 2 mix drinks 3 x/wk.  Plans to cut back a little more  HM: Declines PCV and Shingrix vaccines.  More than 10 yrs since tetanus vaccine.  Agrees to receiving Tdap today Patient Active Problem List   Diagnosis Date Noted   Essential hypertension 09/11/2021   Light smoker 09/11/2021   Hyperlipidemia 09/11/2021   Hip pain, bilateral 09/11/2021   Chronic pain of both knees 09/11/2021    Dyspnea 03/25/2021   CAP (community acquired pneumonia) 07/25/2018   Tobacco use 07/25/2018   Elevated bilirubin 07/25/2018   Alcohol use 07/25/2018   Environmental and seasonal allergies 10/16/2015     Current Outpatient Medications on File Prior to Visit  Medication Sig Dispense Refill   amLODipine (NORVASC) 10 MG tablet Take 1 tablet (10 mg total) by mouth daily. 90 tablet 1   aspirin EC 81 MG tablet Take 81 mg by mouth daily. Swallow whole.     fluticasone (FLONASE) 50 MCG/ACT nasal spray Place 1 spray into both nostrils daily.     rosuvastatin (CRESTOR) 5 MG tablet Take 1 tablet (5 mg total) by mouth daily. 90 tablet 3   valsartan (DIOVAN) 40 MG tablet Take 1 tablet (40 mg total) by mouth daily. 90 tablet 3   No current facility-administered medications on file prior to visit.    Allergies  Allergen Reactions   Meloxicam     Stomach pain   Metronidazole Nausea And Vomiting   Cephalexin Diarrhea    Developed diarrhea after only 2 doses.   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  Looking to get employment with childcare.   Occupation: Psychologist, occupational at a day care    Comment: Retired Astronomer  Tobacco Use   Smoking status: Some Days    Types: Cigarettes    Start date: 10/09/1990   Smokeless tobacco: Never   Tobacco comments:    1 cigg once a week  Vaping Use   Vaping Use: Never used  Substance and Sexual Activity   Alcohol use: Yes    Alcohol/week: 6.0 standard drinks    Types: 6 Shots of liquor per week   Drug use: No   Sexual activity: Yes    Birth control/protection: None  Other Topics Concern   Not on file  Social History Narrative   Born and raised in [place].   Went  to school for speech pathology and communication--spent time at [employer] and [employer]--2 years of college   Lives with 2 daughters, ages 67 and 16 yo.   Social Determinants of Health   Financial Resource Strain: Not on file  Food Insecurity: Not on file  Transportation Needs: Not on file  Physical Activity: Not on file  Stress: Not on file  Social Connections: Not on file  Intimate Partner Violence: Not on file    Family History  Problem Relation Age of Onset   Heart disease Mother    Hypertension Mother    Congestive Heart Failure Mother        Cause of death   Congestive Heart Failure Father    Stroke Father        Cause of death   Hypertension Father    Alcohol abuse Brother    Hypertension Brother    Heart disease Maternal Grandmother    Stroke Maternal Grandmother    Diabetes Sister    Hypertension Sister    Heart disease Sister        Arrhythmia and myocarditis/Congenital Heart Disease/possible arrhythmia   Diabetes Paternal Grandmother    Diabetes Paternal Grandfather    Colitis Neg Hx    Esophageal cancer Neg Hx    Stomach cancer Neg Hx    Rectal cancer Neg Hx     Past Surgical History:  Procedure Laterality Date   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 Right hip: Good flexion and extension.  Moderate discomfort with passive rotation. Left hip: Mild  discomfort with rotation of the hip. Extremities - peripheral pulses normal, no pedal edema, no clubbing or cyanosis      Latest Ref Rng & Units 11/23/2021    4:02 PM 11/03/2021    3:48 PM 09/11/2021  10:17 AM  CMP  Glucose 70 - 99 mg/dL 113   144   93    BUN 8 - 27 mg/dL '19   21   11    ' Creatinine 0.57 - 1.00 mg/dL 0.51   0.78   0.69    Sodium 134 - 144 mmol/L 142   142   142    Potassium 3.5 - 5.2 mmol/L 4.2   4.3   5.2    Chloride 96 - 106 mmol/L 105   104   101    CO2 20 - 29 mmol/L '26   25   25    ' Calcium 8.7 - 10.3 mg/dL 9.6   9.5   10.4    Total Protein 6.0 - 8.5 g/dL   8.1    Total Bilirubin 0.0 - 1.2 mg/dL   0.4    Alkaline Phos 44 - 121 IU/L   98    AST 0 - 40 IU/L   23    ALT 0 - 32 IU/L   27     Lipid Panel     Component Value Date/Time   CHOL 156 09/11/2021 1017   TRIG 102 09/11/2021 1017   HDL 60 09/11/2021 1017   CHOLHDL 2.6 09/11/2021 1017   LDLCALC 77 09/11/2021 1017    CBC    Component Value Date/Time   WBC 8.1 09/11/2021 1017   WBC 7.5 10/31/2018 1828   RBC 4.40 09/11/2021 1017   RBC 5.00 10/31/2018 1828   HGB 14.1 09/11/2021 1017   HCT 40.2 09/11/2021 1017   PLT 290 09/11/2021 1017   MCV 91 09/11/2021 1017   MCH 32.0 09/11/2021 1017   MCH 31.4 10/31/2018 1828   MCHC 35.1 09/11/2021 1017   MCHC 34.0 10/31/2018 1828   RDW 13.2 09/11/2021 1017   LYMPHSABS 2.2 09/11/2021 1017   MONOABS 1.2 (H) 10/29/2018 1725   EOSABS 0.2 09/11/2021 1017   BASOSABS 0.1 09/11/2021 1017    ASSESSMENT AND PLAN: 1. Primary osteoarthritis of both hips We will put her on Celebrex and will give omeprazole to take along with it to help protect the stomach.  I have given a limited course of tramadol to use as needed.  Advised that it is a narcotic medication and can cause some drowsiness.  Advised not to take with alcohol.  [place] controlled substance reporting system reviewed.  Referral submitted to orthopedics. -Note for work given keeping her out of work  until next Tuesday. - Ambulatory referral to Orthopedic Surgery - celecoxib (CELEBREX) 200 MG capsule; Take 1 capsule (200 mg total) by mouth daily.  Dispense: 30 capsule; Refill: 2 - traMADol (ULTRAM) 50 MG tablet; Take 1 tablet (50 mg total) by mouth every 12 (twelve) hours as needed.  Dispense: 15 tablet; Refill: 0  2. Essential hypertension Close to goal.  She will continue Norvasc daily and Diovan 3 times a week.  3. Hyperlipidemia, unspecified hyperlipidemia type Continue Crestor 3 times a week.  4. Tobacco dependence Continue to encourage her to quit smoking.  She is aware of the health risks associated with smoking.  She states she will continue to try to quit.  Declines any medications to help her quit.  5. Need for Tdap vaccination Given today.    Patient was given the opportunity to ask questions.  Patient verbalized understanding of the plan and was able to repeat key elements of the plan.   This documentation was completed using Radio producer.  Any  transcriptional errors are unintentional.  No orders of the defined types were placed in this encounter.    Requested Prescriptions    No prescriptions requested or ordered in this encounter    No follow-ups on file.  Karle Plumber, MD, FACP

## 2022-02-22 ENCOUNTER — Ambulatory Visit: Payer: Self-pay

## 2022-02-22 NOTE — Telephone Encounter (Signed)
Summary: Medication Advice   Pt was seen by Dr Wynetta Emery  02/19/22. Pt would like to know what medication is the antiinflammatory medication    Med was Celebrex- pt looked it up and had no further questions.

## 2022-03-02 ENCOUNTER — Encounter: Payer: Self-pay | Admitting: Orthopaedic Surgery

## 2022-03-02 ENCOUNTER — Ambulatory Visit (INDEPENDENT_AMBULATORY_CARE_PROVIDER_SITE_OTHER): Payer: Medicare Other | Admitting: Orthopaedic Surgery

## 2022-03-02 VITALS — Ht 65.5 in | Wt 119.0 lb

## 2022-03-02 DIAGNOSIS — M1611 Unilateral primary osteoarthritis, right hip: Secondary | ICD-10-CM

## 2022-03-02 DIAGNOSIS — M1612 Unilateral primary osteoarthritis, left hip: Secondary | ICD-10-CM

## 2022-03-02 NOTE — Progress Notes (Signed)
Office Visit Note   Patient: Shelley [name]           Date of Birth: 1956/04/27           MRN: [id number] Visit Date: 03/02/2022              Requested by: Ladell Pier, MD [address] [place],  [place] [id number] PCP: Ladell Pier, MD   Assessment & Plan: Visit Diagnoses:  1. Primary osteoarthritis of right hip   2. Primary osteoarthritis of left hip     Plan: I impression is right greater than left advanced DJD.  Treatment options were explained and reviewed with the patient.  Based on options she would like to continue managing the pain with Celebrex.  She is interested in outpatient PT.  Does not want cortisone injection at this time.  I left information on hip replacement surgery and my card with her.  Follow-Up Instructions: No follow-ups on file.   Orders:  Orders Placed This Encounter  Procedures   Ambulatory referral to Physical Therapy   No orders of the defined types were placed in this encounter.     Procedures: No procedures performed   Clinical Data: No additional findings.   Subjective: Chief Complaint  Patient presents with   Left Hip - Pain   Right Hip - Pain    HPI Shelley [name] is a very pleasant 66 year old female referral from PCP for bilateral hip degenerative joint disease worse on the right.  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  She has trouble with ADLs and with working.  She has had pain for about a year worse on the right hip.  She has pain in the groin and radiation down the leg.  Takes Celebrex which she states has improved her pain.  Review of Systems  Constitutional: Negative.   HENT: Negative.    Eyes: Negative.   Respiratory: Negative.    Cardiovascular: Negative.   Endocrine: Negative.   Musculoskeletal: Negative.   Neurological: Negative.   Hematological: Negative.   Psychiatric/Behavioral: Negative.    All other systems reviewed and are negative.   Objective: Vital Signs: Ht 5' 5.5" (1.664 m)   Wt 119  lb (54 kg)   LMP  (LMP Unknown)   BMI 19.50 kg/m   Physical Exam Vitals and nursing note reviewed.  Constitutional:      Appearance: She is well-developed.  HENT:     Head: Normocephalic and atraumatic.  Pulmonary:     Effort: Pulmonary effort is normal.  Abdominal:     Palpations: Abdomen is soft.  Musculoskeletal:     Cervical back: Neck supple.  Skin:    General: Skin is warm.     Capillary Refill: Capillary refill takes less than 2 seconds.  Neurological:     Mental Status: She is alert and oriented to person, place, and time.  Psychiatric:        Behavior: Behavior normal.        Thought Content: Thought content normal.        Judgment: Judgment normal.    Ortho Exam Examination of the right hip shows painful internal rotation with logroll and FADIR.  Pain with flexion of the hip past 100 degrees.  No sciatic tension signs. Specialty Comments:  No specialty comments available.  Imaging: No results found.   PMFS History: Patient Active Problem List   Diagnosis Date Noted   Primary osteoarthritis of right hip 03/02/2022   Primary osteoarthritis  of left hip 03/02/2022   Primary osteoarthritis of both hips 02/19/2022   Essential hypertension 09/11/2021   Light smoker 09/11/2021   Hyperlipidemia 09/11/2021   Hip pain, bilateral 09/11/2021   Chronic pain of both knees 09/11/2021   Dyspnea 03/25/2021   CAP (community acquired pneumonia) 07/25/2018   Tobacco use 07/25/2018   Elevated bilirubin 07/25/2018   Alcohol use 07/25/2018   Environmental and seasonal allergies 10/16/2015   Past Medical History:  Diagnosis Date   Allergy    Environmental and Seasonal:  Pharyngeal drainage, cough and eye symptoms   Elevated blood pressure reading without diagnosis of hypertension 2016   Hyperlipidemia    Hypertension    Tobacco use     Family History  Problem Relation Age of Onset   Heart disease Mother    Hypertension Mother    Congestive Heart Failure Mother         Cause of death   Congestive Heart Failure Father    Stroke Father        Cause of death   Hypertension Father    Alcohol abuse Brother    Hypertension Brother    Heart disease Maternal Grandmother    Stroke Maternal Grandmother    Diabetes Sister    Hypertension Sister    Heart disease Sister        Arrhythmia and myocarditis/Congenital Heart Disease/possible arrhythmia   Diabetes Paternal Grandmother    Diabetes Paternal Grandfather    Colitis Neg Hx    Esophageal cancer Neg Hx    Stomach cancer Neg Hx    Rectal cancer Neg Hx     Past Surgical History:  Procedure Laterality Date   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  Looking to get employment with childcare.   Occupation: Psychologist, occupational at a day care    Comment: Retired Astronomer  Tobacco Use   Smoking status: Some Days    Types: Cigarettes    Start date: 10/09/1990   Smokeless tobacco: Never   Tobacco comments:    1 cigg once a week  Vaping Use   Vaping Use: Never used  Substance and Sexual Activity   Alcohol use: Yes    Alcohol/week: 6.0 standard drinks    Types: 6 Shots of liquor per week   Drug use: No   Sexual activity: Yes    Birth control/protection: None

## 2022-04-23 ENCOUNTER — Ambulatory Visit: Payer: Medicare Other | Admitting: Internal Medicine

## 2022-04-29 ENCOUNTER — Inpatient Hospital Stay: Admission: RE | Admit: 2022-04-29 | Payer: Medicare Other | Source: Ambulatory Visit

## 2022-05-31 ENCOUNTER — Other Ambulatory Visit: Payer: Self-pay | Admitting: Internal Medicine

## 2022-06-01 NOTE — Telephone Encounter (Signed)
Requested Prescriptions  Pending Prescriptions Disp Refills  . omeprazole (PRILOSEC) 20 MG capsule [Pharmacy Med Name: OMEPRAZOLE '20MG'$  CAPSULES] 90 capsule     Sig: TAKE 1 CAPSULE(20 MG) BY MOUTH DAILY     Gastroenterology: Proton Pump Inhibitors Passed - 05/31/2022  3:55 PM      Passed - Valid encounter within last 12 months    Recent Outpatient Visits          3 months ago Primary osteoarthritis of both hips   [place], MD   6 months ago Essential hypertension   [place], Jarome Matin, RPH-CPP   6 months ago Encounter for completion of form with patient   [place], MD   7 months ago Essential hypertension   [place], Stephen L, RPH-CPP   7 months ago Essential hypertension   [place], RPH-CPP

## 2022-06-22 ENCOUNTER — Other Ambulatory Visit: Payer: Self-pay

## 2022-06-22 ENCOUNTER — Encounter (HOSPITAL_COMMUNITY): Payer: Self-pay | Admitting: Emergency Medicine

## 2022-06-22 ENCOUNTER — Ambulatory Visit (HOSPITAL_COMMUNITY)
Admission: EM | Admit: 2022-06-22 | Discharge: 2022-06-22 | Disposition: A | Discharge status: Home / Self Care | Payer: Medicare Other | Attending: Family Medicine | Admitting: Family Medicine

## 2022-06-22 ENCOUNTER — Ambulatory Visit: Payer: Self-pay | Admitting: *Deleted

## 2022-06-22 DIAGNOSIS — N3090 Cystitis, unspecified without hematuria: Secondary | ICD-10-CM | POA: Diagnosis present

## 2022-06-22 LAB — POCT URINALYSIS DIPSTICK, ED / UC
Bilirubin Urine: NEGATIVE
Glucose, UA: NEGATIVE mg/dL
Ketones, ur: NEGATIVE mg/dL
Nitrite: NEGATIVE
Protein, ur: NEGATIVE mg/dL
Specific Gravity, Urine: 1.02 (ref 1.005–1.030)
Urobilinogen, UA: 1 mg/dL (ref 0.0–1.0)
pH: 6 (ref 5.0–8.0)

## 2022-06-22 MED ORDER — PHENAZOPYRIDINE HCL 200 MG PO TABS: 200.0000 mg | ORAL_TABLET | Freq: Three times a day (TID) | ORAL | 0 refills | Status: DC

## 2022-06-22 MED ORDER — SULFAMETHOXAZOLE-TRIMETHOPRIM 800-160 MG PO TABS: 1.0000 | ORAL_TABLET | Freq: Two times a day (BID) | ORAL | 0 refills | Status: AC

## 2022-06-22 NOTE — Telephone Encounter (Signed)
  Chief Complaint: ? STI Symptoms: burning, pain Frequency: 8 days Pertinent Negatives: Patient denies fever Disposition: '[]'$ ED /'[x]'$ Urgent Care (no appt availability in office) / '[]'$ Appointment(In office/virtual)/ '[]'$  [facility]/ '[]'$ Home Care/ '[]'$ Refused Recommended Disposition /'[]'$ [facility]/ '[]'$  Follow-up with PCP Additional Notes: No visits open, pt given [facility] bus info but pt chose to go to UC, home care discussed.   Reason for Disposition  Preventing STIs, questions about  Patient is worried they have a sexually transmitted infection (STI)  Answer Assessment - Initial Assessment Questions 1. MAIN CONCERN: "What were you exposed to?"  "What sexually transmitted infection (STI) does your sex partner have?" (e.g., gonorrhea, herpes, HIV, pubic lice)     unsure 2. ROUTE of EXPOSURE: "How were you exposed to the STI?" (e.g., oral, vaginal, or rectal intercourse)     Sex, unprotected vaginal 3. DATE of EXPOSURE: "When did the exposure occur?" (e.g., days)     8 days 4. SYMPTOMS: "Do you have any symptoms?" (e.g., pain with urination, rash, sores)     Burning, hurts when urinates, pain inside 5. PREGNANCY: "Is there any chance you are pregnant?" "When was your last menstrual period?"     no  Protocols used: STI Exposure-A-AH

## 2022-06-22 NOTE — ED Triage Notes (Signed)
Reports vaginal burning.  Onset 8 days ago.  Denies discharge.  Pain with urination.  Feels pressure like she needs to urinate urgently, but then may be incontinent

## 2022-06-23 NOTE — ED Provider Notes (Signed)
[facility]    ASSESSMENT & PLAN:  1. Cystitis    Begin: Meds ordered this encounter  Medications   sulfamethoxazole-trimethoprim (BACTRIM DS) 800-160 MG tablet    Sig: Take 1 tablet by mouth 2 (two) times daily for 3 days.    Dispense:  6 tablet    Refill:  0   phenazopyridine (PYRIDIUM) 200 MG tablet    Sig: Take 1 tablet (200 mg total) by mouth 3 (three) times daily.    Dispense:  6 tablet    Refill:  0   No signs of pyelonephritis. Urine culture sent. Will follow up with her PCP or here if not showing improvement over the next 48 hours, sooner if needed.  Outlined signs and symptoms indicating need for more acute intervention. Patient verbalized understanding. After Visit Summary given.  SUBJECTIVE:  Shelley [name] is a 66 y.o. female who complains of urinary frequency, urgency and dysuria for the past week. Without associated flank pain, fever, chills, vaginal discharge or bleeding. Gross hematuria: not present. No specific aggravating or alleviating factors reported. No LE edema. Normal PO intake without n/v/d. Without specific abdominal pain. Ambulatory without difficulty. OTC treatment: none. H/O UTI: infrequent.  LMP: No LMP recorded (lmp unknown). Patient is postmenopausal.   OBJECTIVE:  Vitals:   06/22/22 1821 06/22/22 1823  BP: (!) 153/72 (!) 145/67  Pulse: 79   Resp: 18   Temp: 98.3 F (36.8 C)   TempSrc: Oral   SpO2: 96%    General appearance: alert; no distress HENT: oropharynx: moist Lungs: unlabored respirations Abdomen: soft, non-tender; bowel sounds normal; no masses or organomegaly; no guarding or rebound tenderness Back: no CVA tenderness Extremities: no edema; symmetrical with no gross deformities Skin: warm and dry Neurologic: normal gait Psychological: alert and cooperative; normal mood and affect  Labs Reviewed  URINE CULTURE      POCT URINALYSIS DIPSTICK, ED / UC - Abnormal; Notable for the following components:    Hgb urine dipstick SMALL (*)    Leukocytes,Ua MODERATE (*)    All other components within normal limits    Allergies  Allergen Reactions   Meloxicam     Stomach pain   Metronidazole Nausea And Vomiting   Cephalexin Diarrhea    Developed diarrhea after only 2 doses.   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   Occupation: Psychologist, occupational at a day care    Comment: Retired Astronomer  Tobacco Use   Smoking status: Some Days    Types: Cigarettes    Start date: 10/09/1990   Smokeless tobacco: Never   Tobacco comments:    1 cigg once a week  Vaping Use   Vaping Use: Never used  Substance and Sexual Activity   Alcohol use: Yes    Alcohol/week: 6.0 standard drinks of alcohol    Types: 6 Shots  of liquor per week   Drug use: No   Sexual activity: Yes    Birth control/protection: None  Other Topics Concern   Not on file  Social History Narrative   Born and raised in [place].   Went to school for speech pathology and communication--spent time at [employer] and [employer]--2 years of college   Lives with 2 daughters, ages 36 and 104 yo.   Social Determinants of Health   Financial Resource Strain: Not on file  Food Insecurity: Not on file  Transportation Needs: Not on file  Physical Activity: Not on file  Stress: Not on file  Social Connections: Not on file  Intimate Partner Violence: Not on file   Family History   Problem Relation Age of Onset   Heart disease Mother    Hypertension Mother    Congestive Heart Failure Mother        Cause of death   Congestive Heart Failure Father    Stroke Father        Cause of death   Hypertension Father    Alcohol abuse Brother    Hypertension Brother    Heart disease Maternal Grandmother    Stroke Maternal Grandmother    Diabetes Sister    Hypertension Sister    Heart disease Sister        Arrhythmia and myocarditis/Congenital Heart Disease/possible arrhythmia   Diabetes Paternal Grandmother    Diabetes Paternal Grandfather    Colitis Neg Hx    Esophageal cancer Neg Hx    Stomach cancer Neg Hx    Rectal cancer Neg Hx         Vanessa Kick, MD 06/23/22 [phone]

## 2022-06-24 LAB — URINE CULTURE: Culture: >=100000 — AB

## 2022-07-07 ENCOUNTER — Ambulatory Visit: Payer: Medicare Other | Admitting: Orthopaedic Surgery

## 2022-07-14 ENCOUNTER — Ambulatory Visit: Payer: Medicare Other | Admitting: Orthopaedic Surgery

## 2022-09-17 ENCOUNTER — Ambulatory Visit: Payer: Medicare Other | Admitting: Internal Medicine

## 2022-09-23 ENCOUNTER — Ambulatory Visit: Payer: Medicare Other | Admitting: Orthopaedic Surgery

## 2022-09-28 ENCOUNTER — Other Ambulatory Visit: Payer: Self-pay | Admitting: Internal Medicine

## 2022-11-18 ENCOUNTER — Ambulatory Visit: Payer: 59 | Attending: Internal Medicine

## 2022-11-18 VITALS — Ht 67.0 in | Wt 126.0 lb

## 2022-11-18 DIAGNOSIS — Z0000 Encounter for general adult medical examination without abnormal findings: Secondary | ICD-10-CM | POA: Diagnosis not present

## 2022-11-18 NOTE — Progress Notes (Signed)
I connected with  Shelley [name] on 11/18/22 by a audio enabled telemedicine application and verified that I am speaking with the correct person using two identifiers.  Patient Location: Home  Provider Location: Office/Clinic  I discussed the limitations of evaluation and management by telemedicine. The patient expressed understanding and agreed to proceed.  Subjective:   Shelley [name] is a 67 y.o. female who presents for Medicare Annual (Subsequent) preventive examination.  Review of Systems     Cardiac Risk Factors include: advanced age (>100mn, >>44women);dyslipidemia;hypertension;smoking/ tobacco exposure     Objective:    Today's Vitals   11/18/22 0817  Weight: 126 lb (57.2 kg)  Height: 5' 7"$  (1.702 m)   Body mass index is 19.73 kg/m.     11/18/2022    8:22 AM 10/31/2018    6:22 PM 10/29/2018    5:10 PM 10/22/2018   12:49 PM 07/25/2018    6:00 PM 08/06/2017    9:32 AM 10/16/2015   10:32 AM  Advanced Directives  Does Patient Have a Medical Advance Directive? No No No No No No No  Would patient like information on creating a medical advance directive?  No - Patient declined   Yes (Inpatient - patient requests chaplain consult to create a medical advance directive)  Yes - Educational materials given    Current Medications (verified) Outpatient Encounter Medications as of 11/18/2022  Medication Sig   amLODipine (NORVASC) 10 MG tablet TAKE 1 TABLET(10 MG) BY MOUTH DAILY   fluticasone (FLONASE) 50 MCG/ACT nasal spray Place 1 spray into both nostrils daily.   rosuvastatin (CRESTOR) 5 MG tablet Take 1 tablet (5 mg total) by mouth daily.   valsartan (DIOVAN) 40 MG tablet Take 1 tablet (40 mg total) by mouth daily.   aspirin EC 81 MG tablet Take 81 mg by mouth daily. Swallow whole. (Patient not taking: Reported on 11/18/2022)   celecoxib (CELEBREX) 200 MG capsule Take 1 capsule (200 mg total) by mouth daily. (Patient not taking: Reported on 11/18/2022)   omeprazole (PRILOSEC)  20 MG capsule TAKE 1 CAPSULE(20 MG) BY MOUTH DAILY (Patient not taking: Reported on 11/18/2022)   phenazopyridine (PYRIDIUM) 200 MG tablet Take 1 tablet (200 mg total) by mouth 3 (three) times daily. (Patient not taking: Reported on 11/18/2022)   traMADol (ULTRAM) 50 MG tablet Take 1 tablet (50 mg total) by mouth every 12 (twelve) hours as needed. (Patient not taking: Reported on 06/22/2022)   No facility-administered encounter medications on file as of 11/18/2022.    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   Family History  Problem Relation Age of Onset   Heart disease Mother    Hypertension Mother    Congestive Heart Failure Mother        Cause of death   Congestive Heart Failure Father    Stroke Father        Cause of death   Hypertension Father    Alcohol abuse Brother    Hypertension Brother    Heart disease Maternal Grandmother    Stroke Maternal Grandmother  Diabetes Sister    Hypertension Sister    Heart disease Sister        Arrhythmia and myocarditis/Congenital Heart Disease/possible arrhythmia   Diabetes Paternal Grandmother    Diabetes Paternal Grandfather    Colitis Neg Hx    Esophageal cancer Neg Hx    Stomach cancer Neg Hx    Rectal cancer Neg Hx    Social History   Socioeconomic History   Marital status: Single    Spouse name: Not on file   Number of children: 2   Years of education: 14   Highest education level: Not on file  Occupational History    Comment: 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  Looking  to get employment with childcare.   Occupation: Psychologist, occupational at a day care    Comment: Retired Astronomer  Tobacco Use   Smoking status: Some Days    Types: Cigarettes    Start date: 10/09/1990   Smokeless tobacco: Never   Tobacco comments:    1 cigg once a week  Vaping Use   Vaping Use: Never used  Substance and Sexual Activity   Alcohol use: Yes    Alcohol/week: 6.0 standard drinks of alcohol    Types: 6 Shots of liquor per week   Drug use: No   Sexual activity: Yes    Birth control/protection: None  Other Topics Concern   Not on file  Social History Narrative   Born and raised in [place].   Went to school for speech pathology and communication--spent time at [employer] and [employer]--2 years of college   Lives with 2 daughters, ages 41 and 19 yo.   Social Determinants of Health   Financial Resource Strain: Low Risk  (11/18/2022)   Overall Financial Resource Strain (CARDIA)    Difficulty of Paying Living Expenses: Not hard at all  Food Insecurity: No Food Insecurity (11/18/2022)   Hunger Vital Sign    Worried About Running Out of Food in the Last Year: Never true    Ran Out of Food in the Last Year: Never true  Transportation Needs: No Transportation Needs (11/18/2022)   PRAPARE - Hydrologist (Medical): No    Lack of Transportation (Non-Medical): No  Physical Activity: Inactive (11/18/2022)   Exercise Vital Sign    Days of Exercise per Week: 0 days    Minutes of Exercise per Session: 0 min  Stress: No Stress Concern Present (11/18/2022)   [place]    Feeling of Stress : Not at all  Social Connections: Not on file    Tobacco Counseling Ready to quit: Yes Counseling given: Not Answered Tobacco comments: 1 cigg once a week   Clinical Intake:  Pre-visit preparation completed: Yes  Pain : No/denies pain     Nutritional Status: BMI of 19-24  Normal Nutritional Risks:  None Diabetes: No  How often do you need to have someone help you when you read instructions, pamphlets, or other written materials from your doctor or pharmacy?: 1 - Never  Diabetic? no  Interpreter Needed?: No  Information entered by :: NAllen LPN   Activities of Daily Living    11/18/2022    8:23 AM  In your present state of health, do you have any difficulty performing the following activities:  Hearing? 0  Vision? 1  Comment at night and in the rain  Difficulty concentrating or making decisions? 0  Walking or climbing stairs? 1  Dressing or bathing? 0  Doing errands, shopping? 0  Preparing Food and eating ? N  Using the Toilet? N  In the past six months, have you accidently leaked urine? Y  Do you have problems with loss of bowel control? N  Managing your Medications? N  Managing your Finances? N  Housekeeping or managing your Housekeeping? N    Patient Care Team: Ladell Pier, MD as PCP - General (Internal Medicine)  Indicate any recent Medical Services you may have received from other than [facility] providers in the past year (date may be approximate).     Assessment:   This is a routine wellness examination for Shelley [name].  Hearing/Vision screen Vision Screening - Comments:: No regular eye exams,  Dietary issues and exercise activities discussed: Current Exercise Habits: The patient does not participate in regular exercise at present   Goals Addressed             This Visit's Progress    Patient Stated       11/18/2022, quit smoking, drinking and eating pork       Depression Screen    11/18/2022    8:23 AM 09/11/2021    8:43 AM 10/16/2015   10:33 AM  PHQ 2/9 Scores  PHQ - 2 Score 0 2 1  PHQ- 9 Score  10 3    Fall Risk    11/18/2022    8:23 AM 02/19/2022    3:25 PM 09/11/2021    8:43 AM  Fall Risk   Falls in the past year? 0 0 0  Number falls in past yr: 0 0 0  Injury with Fall? 0 0 0  Risk for fall due to : Medication side effect No Fall  Risks No Fall Risks  Follow up Falls prevention discussed;Education provided;Falls evaluation completed      FALL RISK PREVENTION PERTAINING TO THE HOME:  Any stairs in or around the home? Yes  If so, are there any without handrails? No  Home free of loose throw rugs in walkways, pet beds, electrical cords, etc? Yes  Adequate lighting in your home to reduce risk of falls? Yes   ASSISTIVE DEVICES UTILIZED TO PREVENT FALLS:  Life alert? No  Use of a cane, walker or w/c? No  Grab bars in the bathroom? No  Shower chair or bench in shower? No  Elevated toilet seat or a handicapped toilet? No   TIMED UP AND GO:  Was the test performed? No .      Cognitive Function:        11/18/2022    8:25 AM  6CIT Screen  What Year? 0 points  What month? 0 points  What time? 0 points  Count back from 20 0 points  Months in reverse 0 points  Repeat phrase 0 points  Total Score 0 points    Immunizations Immunization History  Administered Date(s) Administered   PFIZER(Purple Top)SARS-COV-2 Vaccination 06/26/2020, 07/17/2020   PPD Test 03/24/2021   Tdap 02/19/2022    TDAP status: Up to date  Flu Vaccine status: Due, Education has been provided regarding the importance of this vaccine. Advised may receive this vaccine at local pharmacy or Health Dept. Aware to provide a copy of the vaccination record if obtained from local pharmacy or Health Dept. Verbalized acceptance and understanding.  Pneumococcal vaccine status: Declined,  Education has been provided regarding the importance of this vaccine but patient still declined. Advised may receive this vaccine at  local pharmacy or Health Dept. Aware to provide a copy of the vaccination record if obtained from local pharmacy or Health Dept. Verbalized acceptance and understanding.   Covid-19 vaccine status: Completed vaccines  Qualifies for Shingles Vaccine? Yes   Zostavax completed No   Shingrix Completed?: No.    Education has been  provided regarding the importance of this vaccine. Patient has been advised to call insurance company to determine out of pocket expense if they have not yet received this vaccine. Advised may also receive vaccine at local pharmacy or Health Dept. Verbalized acceptance and understanding.  Screening Tests Health Maintenance  Topic Date Due   Zoster Vaccines- Shingrix (1 of 2) Never done   DEXA SCAN  Never done   INFLUENZA VACCINE  Never done   COVID-19 Vaccine (3 - 2023-24 season) 06/04/2022   Pneumonia Vaccine 91+ Years old (1 of 2 - PCV) 02/20/2023 (Originally 10/09/1961)   MAMMOGRAM  12/03/2022   Medicare Annual Wellness (AWV)  11/19/2023   COLONOSCOPY (Pts 45-43yr Insurance coverage will need to be confirmed)  02/13/2024   DTaP/Tdap/Td (2 - Td or Tdap) 02/20/2032   Hepatitis C Screening  Completed   HPV VACCINES  Aged Out    Health Maintenance  Health Maintenance Due  Topic Date Due   Zoster Vaccines- Shingrix (1 of 2) Never done   DEXA SCAN  Never done   INFLUENZA VACCINE  Never done   COVID-19 Vaccine (3 - 2023-24 season) 06/04/2022    Colorectal cancer screening: Type of screening: Colonoscopy. Completed 02/12/2021. Repeat every 3 years  Mammogram status: Completed 12/02/2020. Repeat every year  Bone Density status: Completed 2023.   Lung Cancer Screening: (Low Dose CT Chest recommended if Age 68-80years, 30 pack-year currently smoking OR have quit w/in 15years.) does not qualify.   Lung Cancer Screening Referral: no  Additional Screening:  Hepatitis C Screening: does qualify; Completed 09/11/2021  Vision Screening: Recommended annual ophthalmology exams for early detection of glaucoma and other disorders of the eye. Is the patient up to date with their annual eye exam?  No  Who is the provider or what is the name of the office in which the patient attends annual eye exams? none If pt is not established with a provider, would they like to be referred to a provider to  establish care? No .   Dental Screening: Recommended annual dental exams for proper oral hygiene  Community Resource Referral / Chronic Care Management: CRR required this visit?  No   CCM required this visit?  No      Plan:     I have personally reviewed and noted the following in the patient's chart:   Medical and social history Use of alcohol, tobacco or illicit drugs  Current medications and supplements including opioid prescriptions. Patient is not currently taking opioid prescriptions. Functional ability and status Nutritional status Physical activity Advanced directives List of other physicians Hospitalizations, surgeries, and ER visits in previous 12 months Vitals Screenings to include cognitive, depression, and falls Referrals and appointments  In addition, I have reviewed and discussed with patient certain preventive protocols, quality metrics, and best practice recommendations. A written personalized care plan for preventive services as well as general preventive health recommendations were provided to patient.     NKellie Simmering LPN   [id number]  Nurse Notes: none  Due to this being a virtual visit, the after visit summary with patients personalized plan was offered to patient via mail or my-chart.  to pick up at office at next visit

## 2022-11-18 NOTE — Patient Instructions (Signed)
Shelley [name] , Thank you for taking time to come for your Medicare Wellness Visit. I appreciate your ongoing commitment to your health goals. Please review the following plan we discussed and let me know if I can assist you in the future.   These are the goals we discussed:  Goals      Patient Stated     11/18/2022, quit smoking, drinking and eating pork        This is a list of the screening recommended for you and due dates:  Health Maintenance  Topic Date Due   Zoster (Shingles) Vaccine (1 of 2) Never done   DEXA scan (bone density measurement)  Never done   Flu Shot  Never done   COVID-19 Vaccine (3 - 2023-24 season) 06/04/2022   Pneumonia Vaccine (1 of 2 - PCV) 02/20/2023*   Mammogram  12/03/2022   Medicare Annual Wellness Visit  11/19/2023   Colon Cancer Screening  02/13/2024   DTaP/Tdap/Td vaccine (2 - Td or Tdap) 02/20/2032   Hepatitis C Screening: USPSTF Recommendation to screen - Ages 72-79 yo.  Completed   HPV Vaccine  Aged Out  *Topic was postponed. The date shown is not the original due date.    Advanced directives: Advance directive discussed with you today.   Conditions/risks identified: smoking  Next appointment: Follow up in one year for your annual wellness visit    Preventive Care 67 Years and Older, Female Preventive care refers to lifestyle choices and visits with your health care provider that can promote health and wellness. What does preventive care include? A yearly physical exam. This is also called an annual well check. Dental exams once or twice a year. Routine eye exams. Ask your health care provider how often you should have your eyes checked. Personal lifestyle choices, including: Daily care of your teeth and gums. Regular physical activity. Eating a healthy diet. Avoiding tobacco and drug use. Limiting alcohol use. Practicing safe sex. Taking low-dose aspirin every day. Taking vitamin and mineral supplements as recommended by your health  care provider. What happens during an annual well check? The services and screenings done by your health care provider during your annual well check will depend on your age, overall health, lifestyle risk factors, and family history of disease. Counseling  Your health care provider may ask you questions about your: Alcohol use. Tobacco use. Drug use. Emotional well-being. Home and relationship well-being. Sexual activity. Eating habits. History of falls. Memory and ability to understand (cognition). Work and work Statistician. Reproductive health. Screening  You may have the following tests or measurements: Height, weight, and BMI. Blood pressure. Lipid and cholesterol levels. These may be checked every 5 years, or more frequently if you are over 52 years old. Skin check. Lung cancer screening. You may have this screening every year starting at age 75 if you have a 30-pack-year history of smoking and currently smoke or have quit within the past 15 years. Fecal occult blood test (FOBT) of the stool. You may have this test every year starting at age 12. Flexible sigmoidoscopy or colonoscopy. You may have a sigmoidoscopy every 5 years or a colonoscopy every 10 years starting at age 4. Hepatitis C blood test. Hepatitis B blood test. Sexually transmitted disease (STD) testing. Diabetes screening. This is done by checking your blood sugar (glucose) after you have not eaten for a while (fasting). You may have this done every 1-3 years. Bone density scan. This is done to screen for osteoporosis. You may  have this done starting at age 36. Mammogram. This may be done every 1-2 years. Talk to your health care provider about how often you should have regular mammograms. Talk with your health care provider about your test results, treatment options, and if necessary, the need for more tests. Vaccines  Your health care provider may recommend certain vaccines, such as: Influenza vaccine. This is  recommended every year. Tetanus, diphtheria, and acellular pertussis (Tdap, Td) vaccine. You may need a Td booster every 10 years. Zoster vaccine. You may need this after age 51. Pneumococcal 13-valent conjugate (PCV13) vaccine. One dose is recommended after age 25. Pneumococcal polysaccharide (PPSV23) vaccine. One dose is recommended after age 50. Talk to your health care provider about which screenings and vaccines you need and how often you need them. This information is not intended to replace advice given to you by your health care provider. Make sure you discuss any questions you have with your health care provider. Document Released: 10/17/2015 Document Revised: 06/09/2016 Document Reviewed: 07/22/2015 Elsevier Interactive Patient Education  2017 [place] Prevention in the Home Falls can cause injuries. They can happen to people of all ages. There are many things you can do to make your home safe and to help prevent falls. What can I do on the outside of my home? Regularly fix the edges of walkways and driveways and fix any cracks. Remove anything that might make you trip as you walk through a door, such as a raised step or threshold. Trim any bushes or trees on the path to your home. Use bright outdoor lighting. Clear any walking paths of anything that might make someone trip, such as rocks or tools. Regularly check to see if handrails are loose or broken. Make sure that both sides of any steps have handrails. Any raised decks and porches should have guardrails on the edges. Have any leaves, snow, or ice cleared regularly. Use sand or salt on walking paths during winter. Clean up any spills in your garage right away. This includes oil or grease spills. What can I do in the bathroom? Use night lights. Install grab bars by the toilet and in the tub and shower. Do not use towel bars as grab bars. Use non-skid mats or decals in the tub or shower. If you need to sit down in  the shower, use a plastic, non-slip stool. Keep the floor dry. Clean up any water that spills on the floor as soon as it happens. Remove soap buildup in the tub or shower regularly. Attach bath mats securely with double-sided non-slip rug tape. Do not have throw rugs and other things on the floor that can make you trip. What can I do in the bedroom? Use night lights. Make sure that you have a light by your bed that is easy to reach. Do not use any sheets or blankets that are too big for your bed. They should not hang down onto the floor. Have a firm chair that has side arms. You can use this for support while you get dressed. Do not have throw rugs and other things on the floor that can make you trip. What can I do in the kitchen? Clean up any spills right away. Avoid walking on wet floors. Keep items that you use a lot in easy-to-reach places. If you need to reach something above you, use a strong step stool that has a grab bar. Keep electrical cords out of the way. Do not use floor polish  or wax that makes floors slippery. If you must use wax, use non-skid floor wax. Do not have throw rugs and other things on the floor that can make you trip. What can I do with my stairs? Do not leave any items on the stairs. Make sure that there are handrails on both sides of the stairs and use them. Fix handrails that are broken or loose. Make sure that handrails are as long as the stairways. Check any carpeting to make sure that it is firmly attached to the stairs. Fix any carpet that is loose or worn. Avoid having throw rugs at the top or bottom of the stairs. If you do have throw rugs, attach them to the floor with carpet tape. Make sure that you have a light switch at the top of the stairs and the bottom of the stairs. If you do not have them, ask someone to add them for you. What else can I do to help prevent falls? Wear shoes that: Do not have high heels. Have rubber bottoms. Are comfortable  and fit you well. Are closed at the toe. Do not wear sandals. If you use a stepladder: Make sure that it is fully opened. Do not climb a closed stepladder. Make sure that both sides of the stepladder are locked into place. Ask someone to hold it for you, if possible. Clearly mark and make sure that you can see: Any grab bars or handrails. First and last steps. Where the edge of each step is. Use tools that help you move around (mobility aids) if they are needed. These include: Canes. Walkers. Scooters. Crutches. Turn on the lights when you go into a dark area. Replace any light bulbs as soon as they burn out. Set up your furniture so you have a clear path. Avoid moving your furniture around. If any of your floors are uneven, fix them. If there are any pets around you, be aware of where they are. Review your medicines with your doctor. Some medicines can make you feel dizzy. This can increase your chance of falling. Ask your doctor what other things that you can do to help prevent falls. This information is not intended to replace advice given to you by your health care provider. Make sure you discuss any questions you have with your health care provider. Document Released: 07/17/2009 Document Revised: 02/26/2016 Document Reviewed: 10/25/2014 Elsevier Interactive Patient Education  2017 [employer].

## 2022-11-26 ENCOUNTER — Encounter: Payer: Self-pay | Admitting: Internal Medicine

## 2022-11-26 ENCOUNTER — Ambulatory Visit: Payer: 59 | Attending: Internal Medicine | Admitting: Internal Medicine

## 2022-11-26 VITALS — BP 136/65 | HR 70 | Temp 98.2°F | Ht 67.0 in | Wt 122.0 lb

## 2022-11-26 DIAGNOSIS — Z2821 Immunization not carried out because of patient refusal: Secondary | ICD-10-CM

## 2022-11-26 DIAGNOSIS — Z780 Asymptomatic menopausal state: Secondary | ICD-10-CM

## 2022-11-26 DIAGNOSIS — M160 Bilateral primary osteoarthritis of hip: Secondary | ICD-10-CM

## 2022-11-26 DIAGNOSIS — M25562 Pain in left knee: Secondary | ICD-10-CM

## 2022-11-26 DIAGNOSIS — F17200 Nicotine dependence, unspecified, uncomplicated: Secondary | ICD-10-CM

## 2022-11-26 DIAGNOSIS — E785 Hyperlipidemia, unspecified: Secondary | ICD-10-CM | POA: Diagnosis not present

## 2022-11-26 DIAGNOSIS — R0683 Snoring: Secondary | ICD-10-CM

## 2022-11-26 DIAGNOSIS — M25561 Pain in right knee: Secondary | ICD-10-CM | POA: Diagnosis not present

## 2022-11-26 DIAGNOSIS — G8929 Other chronic pain: Secondary | ICD-10-CM

## 2022-11-26 DIAGNOSIS — F1090 Alcohol use, unspecified, uncomplicated: Secondary | ICD-10-CM

## 2022-11-26 DIAGNOSIS — I10 Essential (primary) hypertension: Secondary | ICD-10-CM | POA: Diagnosis not present

## 2022-11-26 NOTE — Progress Notes (Signed)
Patient ID: Shelley [name], female    DOB: 20-Jul-1956  MRN: [id number]  CC: Osteoarthritis (Osteoarthritis & HTN f/u. Med refill - Valsartan, amlodipine/Requesting referral due to sleep apnea, requesting referral to surgery for weakness of upper body/Requesting form for handicap parking )   Subjective: Shelley [name] is a 67 y.o. female who presents for chronic ds management Her concerns today include:  Patient with history of HTN, HL, tob dep, EtOH use, allergies, OA hips  OA hips:  saw Dr Erlinda Hong 02/2022 declined surgical option at the time.  Now feels it is time to consider THR for  RT hip.  Feels it will give out at times, can not do certain household chores and interfers with sleep.  Beginning to notice pain in both knees x 3 mths.  No swelling Takes Osteo Biflex 2 tabs daily which she feels is more helpful to her than Celebrex.  Prior x-rays showed moderate to severe arthritis of the right hip, moderate arthritis of the left hip, mild arthritis in both knees.   HTN:  on Norvasc 10 mg and Valsartan 40 mg.  Usually take meds at nights but missed last night; so she took this a.m.   [facility] did [facility] visit last wk and BP was 122/80 Limits salt in foods but went a bit over board yesterday  HL:  Suppose to be on Crestor 3x/wk.  she stopped Crestor shortly after last visit with me 02/2022 because she was having so much new pains she did not know what was going on.  Trying to do dietary changes  Tob dep:  still smoking.  1/4 pk daily.  Trying to quit.    ETOH:  "I buy less and I go less often."  Goal is to try to quit completely.  Patient thinks she has sleep apnea.  She states that her daughter tells her that she snores very loud and stops breathing in her sleep.  She wakes up in the mornings feeling unrefreshed.  She has sleepiness and tiredness during the day.  No morning headaches.  HM:  declines flu shot and shingrix.  Plans to get COVID booster.   Patient Active Problem List   Diagnosis  Date Noted   Primary osteoarthritis of right hip 03/02/2022   Primary osteoarthritis of left hip 03/02/2022   Primary osteoarthritis of both hips 02/19/2022   Essential hypertension 09/11/2021   Light smoker 09/11/2021   Hyperlipidemia 09/11/2021   Hip pain, bilateral 09/11/2021   Chronic pain of both knees 09/11/2021   Dyspnea 03/25/2021   CAP (community acquired pneumonia) 07/25/2018   Tobacco use 07/25/2018   Elevated bilirubin 07/25/2018   Alcohol use 07/25/2018   Environmental and seasonal allergies 10/16/2015     Current Outpatient Medications on File Prior to Visit  Medication Sig Dispense Refill   amLODipine (NORVASC) 10 MG tablet TAKE 1 TABLET(10 MG) BY MOUTH DAILY 90 tablet 0   aspirin EC 81 MG tablet Take 81 mg by mouth daily. Swallow whole.     fluticasone (FLONASE) 50 MCG/ACT nasal spray Place 1 spray into both nostrils daily.     valsartan (DIOVAN) 40 MG tablet Take 1 tablet (40 mg total) by mouth daily. 90 tablet 3   celecoxib (CELEBREX) 200 MG capsule Take 1 capsule (200 mg total) by mouth daily. (Patient not taking: Reported on 11/18/2022) 30 capsule 2   omeprazole (PRILOSEC) 20 MG capsule TAKE 1 CAPSULE(20 MG) BY MOUTH DAILY (Patient not taking: Reported on 11/18/2022) 90 capsule  0   phenazopyridine (PYRIDIUM) 200 MG tablet Take 1 tablet (200 mg total) by mouth 3 (three) times daily. (Patient not taking: Reported on 11/18/2022) 6 tablet 0   rosuvastatin (CRESTOR) 5 MG tablet Take 1 tablet (5 mg total) by mouth daily. (Patient not taking: Reported on 11/26/2022) 90 tablet 3   traMADol (ULTRAM) 50 MG tablet Take 1 tablet (50 mg total) by mouth every 12 (twelve) hours as needed. (Patient not taking: Reported on 06/22/2022) 15 tablet 0   No current facility-administered medications on file prior to visit.    Allergies  Allergen Reactions   Meloxicam     Stomach pain   Metronidazole Nausea And Vomiting   Cephalexin Diarrhea    Developed diarrhea after only 2 doses.    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   Occupation: Psychologist, occupational at a day care    Comment: Retired Astronomer  Tobacco Use   Smoking status: Some Days    Types: Cigarettes    Start date: 10/09/1990   Smokeless tobacco: Never   Tobacco comments:    1 cigg once a week  Vaping Use   Vaping Use: Never used  Substance and Sexual Activity   Alcohol use: Yes    Alcohol/week: 6.0 standard drinks of alcohol    Types: 6 Shots of liquor per week   Drug use: No   Sexual activity: Yes    Birth control/protection: None  Other Topics Concern   Not on file  Social History Narrative   Born [place] raised [place].   Went to school for speech pathology and communication--spent time at [employer] and [employer]--2 years of college   Lives with 2 daughters, ages 25 and 40 yo.   Social Determinants of Health   Financial Resource Strain: Low Risk  (11/18/2022)   Overall Financial Resource Strain (CARDIA)    Difficulty of Paying Living Expenses: Not hard at all  Food Insecurity: No Food Insecurity (11/18/2022)   Hunger Vital Sign    Worried About Running Out of Food in the Last Year: Never true    Ran Out of Food in the Last Year: Never true  Transportation Needs: No Transportation Needs (11/18/2022)   PRAPARE - Hydrologist (Medical): No    Lack of Transportation (Non-Medical): No  Physical Activity: Inactive (11/18/2022)   Exercise Vital Sign    Days of Exercise per Week: 0 days    Minutes of Exercise per Session: 0 min  Stress: No Stress Concern Present (11/18/2022)   [place]     Feeling of Stress : Not at all  Social Connections: Not on file  Intimate Partner Violence: Not on file    Family History  Problem Relation Age of Onset   Heart disease Mother    Hypertension Mother    Congestive Heart Failure Mother        Cause of death   Congestive Heart Failure Father    Stroke Father        Cause of death   Hypertension Father  Alcohol abuse Brother    Hypertension Brother    Heart disease Maternal Grandmother    Stroke Maternal Grandmother    Diabetes Sister    Hypertension Sister    Heart disease Sister        Arrhythmia and myocarditis/Congenital Heart Disease/possible arrhythmia   Diabetes Paternal Grandmother    Diabetes Paternal Grandfather    Colitis Neg Hx    Esophageal cancer Neg Hx    Stomach cancer Neg Hx    Rectal cancer Neg Hx     Past Surgical History:  Procedure Laterality Date   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    ROS: Review of Systems Negative except as stated above  PHYSICAL EXAM: BP 136/65 (BP Location: Left Arm, Patient Position: Sitting, Cuff Size: Normal)   Pulse 70   Temp 98.2 F (36.8 C) (Oral)   Ht '5\' 7"'$  (1.702 m)   Wt 122 lb (55.3 kg)   LMP  (LMP Unknown)   SpO2 98%   BMI 19.11 kg/m   Physical Exam  General appearance - alert, well appearing, older African-American female and in no distress Mental status - normal mood, behavior, speech, dress, motor activity, and thought processes Neck - supple, no significant adenopathy Chest - clear to auscultation, no wheezes, rales or rhonchi, symmetric air entry Heart - normal rate, regular rhythm, normal S1, S2, no murmurs, rubs, clicks or gallops Musculoskeletal -knees: No point tenderness.  She has good range of motion of both knees.  She ambulates unassisted. Extremities - peripheral pulses normal, no pedal edema, no clubbing or cyanosis     11/26/2022    2:27 PM 11/18/2022    8:23 AM 02/19/2022    3:26 PM  Depression screen  PHQ 2/9  Decreased Interest 2 0   Down, Depressed, Hopeless 2 0   PHQ - 2 Score 4 0   Altered sleeping 2  3  Tired, decreased energy 2  2  Change in appetite 3  2  Feeling bad or failure about yourself  2  1  Trouble concentrating 2  0  Moving slowly or fidgety/restless 1  0  Suicidal thoughts 0  0  PHQ-9 Score 16         Latest Ref Rng & Units 11/23/2021    4:02 PM 11/03/2021    3:48 PM 09/11/2021   10:17 AM  CMP  Glucose 70 - 99 mg/dL 113  144  93   BUN 8 - 27 mg/dL '19  21  11   '$ Creatinine 0.57 - 1.00 mg/dL 0.51  0.78  0.69   Sodium 134 - 144 mmol/L 142  142  142   Potassium 3.5 - 5.2 mmol/L 4.2  4.3  5.2   Chloride 96 - 106 mmol/L 105  104  101   CO2 20 - 29 mmol/L '26  25  25   '$ Calcium 8.7 - 10.3 mg/dL 9.6  9.5  10.4   Total Protein 6.0 - 8.5 g/dL   8.1   Total Bilirubin 0.0 - 1.2 mg/dL   0.4   Alkaline Phos 44 - 121 IU/L   98   AST 0 - 40 IU/L   23   ALT 0 - 32 IU/L   27    Lipid Panel     Component Value Date/Time   CHOL 156 09/11/2021 1017   TRIG 102 09/11/2021 1017   HDL 60 09/11/2021 1017   CHOLHDL 2.6 09/11/2021 1017  LDLCALC 77 09/11/2021 1017    CBC    Component Value Date/Time   WBC 8.1 09/11/2021 1017   WBC 7.5 10/31/2018 1828   RBC 4.40 09/11/2021 1017   RBC 5.00 10/31/2018 1828   HGB 14.1 09/11/2021 1017   HCT 40.2 09/11/2021 1017   PLT 290 09/11/2021 1017   MCV 91 09/11/2021 1017   MCH 32.0 09/11/2021 1017   MCH 31.4 10/31/2018 1828   MCHC 35.1 09/11/2021 1017   MCHC 34.0 10/31/2018 1828   RDW 13.2 09/11/2021 1017   LYMPHSABS 2.2 09/11/2021 1017   MONOABS 1.2 (H) 10/29/2018 1725   EOSABS 0.2 09/11/2021 1017   BASOSABS 0.1 09/11/2021 1017    ASSESSMENT AND PLAN:  1. Essential hypertension Close to goal.  Continue Norvasc and valsartan 40 mg daily.  Return in 6 weeks for recheck of blood pressure.  DASH diet encouraged. - CBC - Comprehensive metabolic panel  2. Hyperlipidemia, unspecified hyperlipidemia type Patient states she  still has Crestor at home.  She plans to restart it taking it 3 days a week as was previously prescribed.  Continue to encourage her healthy eating habits. - Lipid panel  3. Tobacco dependence Advised to quit.  Patient states she is trying to do so.  She declines any medications to help her quit.  4. Chronic pain of both knees - Ambulatory referral to Orthopedics  5. Primary osteoarthritis of both hips We will get her back in with Dr. Erlinda Hong for her right hip in particular. - Ambulatory referral to Orthopedics  6. Loud snoring History suggest that she may have sleep apnea.  She is agreeable to doing a sleep test. - Home sleep test  7. Postmenopausal estrogen deficiency Due for bone density study.  Patient agreeable to having this done. - DG Bone Density; Future - VITAMIN D 25 Hydroxy (Vit-D Deficiency, Fractures)  8. Alcohol use disorder Patient vague about the amount that she is currently drinking but states she has cut back.  I have encouraged her to quit.  9. Influenza vaccination declined   Patient was given the opportunity to ask questions.  Patient verbalized understanding of the plan and was able to repeat key elements of the plan.   This documentation was completed using Radio producer.  Any transcriptional errors are unintentional.  Orders Placed This Encounter  Procedures   DG Bone Density   CBC   Comprehensive metabolic panel   Lipid panel   VITAMIN D 25 Hydroxy (Vit-D Deficiency, Fractures)   Ambulatory referral to Orthopedics   Home sleep test     Requested Prescriptions    No prescriptions requested or ordered in this encounter    Return in about 6 weeks (around 01/07/2023) for f/u in 6 wks with me for BP recheck.  Karle Plumber, MD, FACP

## 2022-11-27 ENCOUNTER — Other Ambulatory Visit: Payer: Self-pay | Admitting: Internal Medicine

## 2022-11-27 DIAGNOSIS — I10 Essential (primary) hypertension: Secondary | ICD-10-CM

## 2022-11-27 LAB — COMPREHENSIVE METABOLIC PANEL WITH GFR
ALT: 16 IU/L (ref 0–32)
AST: 13 IU/L (ref 0–40)
Albumin/Globulin Ratio: 1.9 (ref 1.2–2.2)
Albumin: 4.9 g/dL (ref 3.9–4.9)
Alkaline Phosphatase: 98 IU/L (ref 44–121)
BUN/Creatinine Ratio: 26 (ref 12–28)
BUN: 15 mg/dL (ref 8–27)
Bilirubin Total: 0.6 mg/dL (ref 0.0–1.2)
CO2: 25 mmol/L (ref 20–29)
Calcium: 10.1 mg/dL (ref 8.7–10.3)
Chloride: 101 mmol/L (ref 96–106)
Creatinine, Ser: 0.57 mg/dL (ref 0.57–1.00)
Globulin, Total: 2.6 g/dL (ref 1.5–4.5)
Glucose: 86 mg/dL (ref 70–99)
Potassium: 4.3 mmol/L (ref 3.5–5.2)
Sodium: 141 mmol/L (ref 134–144)
Total Protein: 7.5 g/dL (ref 6.0–8.5)
eGFR: 99 mL/min/1.73

## 2022-11-27 LAB — VITAMIN D 25 HYDROXY (VIT D DEFICIENCY, FRACTURES): Vit D, 25-Hydroxy: 14.6 ng/mL — ABNORMAL LOW (ref 30.0–100.0)

## 2022-11-27 LAB — CBC
Hematocrit: 40.8 % (ref 34.0–46.6)
Hemoglobin: 13.8 g/dL (ref 11.1–15.9)
MCH: 31.4 pg (ref 26.6–33.0)
MCHC: 33.8 g/dL (ref 31.5–35.7)
MCV: 93 fL (ref 79–97)
Platelets: 246 x10E3/uL (ref 150–450)
RBC: 4.4 x10E6/uL (ref 3.77–5.28)
RDW: 13.5 % (ref 11.7–15.4)
WBC: 7.1 x10E3/uL (ref 3.4–10.8)

## 2022-11-27 LAB — LIPID PANEL
Chol/HDL Ratio: 3.6 ratio (ref 0.0–4.4)
Cholesterol, Total: 235 mg/dL — ABNORMAL HIGH (ref 100–199)
HDL: 65 mg/dL (ref >39)
LDL Chol Calc (NIH): 143 mg/dL — ABNORMAL HIGH (ref 0–99)
Triglycerides: 150 mg/dL — ABNORMAL HIGH (ref 0–149)
VLDL Cholesterol Cal: 27 mg/dL (ref 5–40)

## 2022-11-27 MED ORDER — AMLODIPINE BESYLATE 10 MG PO TABS: ORAL_TABLET | ORAL | 1 refills | Status: DC

## 2022-11-27 MED ORDER — VALSARTAN 40 MG PO TABS: 40.0000 mg | ORAL_TABLET | Freq: Every day | ORAL | 3 refills | Status: DC

## 2022-11-27 MED ORDER — VITAMIN D (ERGOCALCIFEROL) 1.25 MG (50000 UNIT) PO CAPS: 50000.0000 [IU] | ORAL_CAPSULE | ORAL | 1 refills | Status: DC

## 2022-12-09 ENCOUNTER — Ambulatory Visit: Payer: 59 | Admitting: Orthopaedic Surgery

## 2022-12-22 ENCOUNTER — Ambulatory Visit: Payer: 59 | Admitting: Orthopaedic Surgery

## 2023-01-06 ENCOUNTER — Ambulatory Visit: Payer: 59 | Admitting: Orthopaedic Surgery

## 2023-01-10 ENCOUNTER — Ambulatory Visit: Payer: 59 | Admitting: Internal Medicine

## 2023-01-14 ENCOUNTER — Ambulatory Visit: Payer: 59 | Admitting: Orthopaedic Surgery

## 2023-02-04 ENCOUNTER — Ambulatory Visit (INDEPENDENT_AMBULATORY_CARE_PROVIDER_SITE_OTHER): Payer: 59 | Admitting: Orthopaedic Surgery

## 2023-02-04 DIAGNOSIS — M1612 Unilateral primary osteoarthritis, left hip: Secondary | ICD-10-CM | POA: Diagnosis not present

## 2023-02-04 DIAGNOSIS — M1611 Unilateral primary osteoarthritis, right hip: Secondary | ICD-10-CM | POA: Diagnosis not present

## 2023-02-04 NOTE — Progress Notes (Signed)
Office Visit Note   Patient: Shelley [name]           Date of Birth: 01/22/1956           MRN: [id number] Visit Date: 02/04/2023              Requested by: Marcine Matar, MD [address] [place] [place],  [place] [address] PCP: Marcine Matar, MD   Assessment & Plan: Visit Diagnoses:  1. Primary osteoarthritis of right hip   2. Primary osteoarthritis of left hip     Plan: Impression is 67 year old female with end-stage bilateral hip DJD with worsening symptoms.  At this point she would like to try bilateral intra-articular steroid injections.  Hopefully these will provide her with relief as she prepares mentally for surgery.  Follow-Up Instructions: No follow-ups on file.   Orders:  No orders of the defined types were placed in this encounter.  No orders of the defined types were placed in this encounter.     Procedures: No procedures performed   Clinical Data: No additional findings.   Subjective: Chief Complaint  Patient presents with   Right Hip - Pain    HPI Windy Fast returns today for bilateral hip pain that has gotten worse.  She is known advanced DJD in both hips slightly worse on the right.  She has trouble with ambulation and daily activities.  She would like to try some steroid injections to see if these will be beneficial. Review of Systems   Objective: Vital Signs: LMP  (LMP Unknown)   Physical Exam  Ortho Exam Examination of bilateral hip shows very limited range of motion with significant pain Specialty Comments:  No specialty comments available.  Imaging: No results found.   PMFS History: Patient Active Problem List   Diagnosis Date Noted   Primary osteoarthritis of right hip 03/02/2022   Primary osteoarthritis of left hip 03/02/2022   Primary osteoarthritis of both hips 02/19/2022   Essential hypertension 09/11/2021   Light smoker 09/11/2021   Hyperlipidemia 09/11/2021   Hip pain, bilateral 09/11/2021   Chronic pain of  both knees 09/11/2021   Dyspnea 03/25/2021   CAP (community acquired pneumonia) 07/25/2018   Tobacco use 07/25/2018   Elevated bilirubin 07/25/2018   Alcohol use 07/25/2018   Environmental and seasonal allergies 10/16/2015   Past Medical History:  Diagnosis Date   Allergy    Environmental and Seasonal:  Pharyngeal drainage, cough and eye symptoms   Elevated blood pressure reading without diagnosis of hypertension 2016   Hyperlipidemia    Hypertension    Tobacco use     Family History  Problem Relation Age of Onset   Heart disease Mother    Hypertension Mother    Congestive Heart Failure Mother        Cause of death   Congestive Heart Failure Father    Stroke Father        Cause of death   Hypertension Father    Alcohol abuse Brother    Hypertension Brother    Heart disease Maternal Grandmother    Stroke Maternal Grandmother    Diabetes Sister    Hypertension Sister    Heart disease Sister        Arrhythmia and myocarditis/Congenital Heart Disease/possible arrhythmia   Diabetes Paternal Grandmother    Diabetes Paternal Grandfather    Colitis Neg Hx    Esophageal cancer Neg Hx    Stomach cancer Neg Hx    Rectal cancer  Neg Hx     Past Surgical History:  Procedure Laterality Date   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  Looking to get employment with childcare.   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

## 2023-02-08 ENCOUNTER — Ambulatory Visit: Payer: 59 | Admitting: Sports Medicine

## 2023-02-10 ENCOUNTER — Encounter: Payer: Self-pay | Admitting: Sports Medicine

## 2023-02-10 ENCOUNTER — Other Ambulatory Visit: Payer: Self-pay

## 2023-02-10 ENCOUNTER — Ambulatory Visit (INDEPENDENT_AMBULATORY_CARE_PROVIDER_SITE_OTHER): Payer: 59 | Admitting: Sports Medicine

## 2023-02-10 DIAGNOSIS — M1612 Unilateral primary osteoarthritis, left hip: Secondary | ICD-10-CM

## 2023-02-10 DIAGNOSIS — M1611 Unilateral primary osteoarthritis, right hip: Secondary | ICD-10-CM | POA: Diagnosis not present

## 2023-02-10 MED ORDER — METHYLPREDNISOLONE ACETATE 40 MG/ML IJ SUSP
40.0000 mg | INTRAMUSCULAR | Status: AC | PRN
  Administered 2023-02-10: 40 mg via INTRA_ARTICULAR

## 2023-02-10 MED ORDER — LIDOCAINE HCL 1 % IJ SOLN
4.0000 mL | INTRAMUSCULAR | Status: AC | PRN
  Administered 2023-02-10: 4 mL

## 2023-02-10 NOTE — Progress Notes (Signed)
   Procedure Note  Patient: Shelley [name]             Date of Birth: 24-Feb-1956           MRN: [id number]             Visit Date: 02/10/2023  Procedures: Visit Diagnoses:  1. Primary osteoarthritis of right hip   2. Primary osteoarthritis of left hip    Large Joint Inj: R hip joint on 02/10/2023 9:45 AM Indications: pain Details: 22 G 3.5 in needle, ultrasound-guided anterior approach Medications: 4 mL lidocaine 1 %; 40 mg methylPREDNISolone acetate 40 MG/ML Outcome: tolerated well, no immediate complications  Procedure: US-guided intra-articular hip injection, Right After discussion on risks/benefits/indications and informed verbal consent was obtained, a timeout was performed. Patient was lying supine on exam table. The hip was cleaned with betadine and alcohol swabs. Then utilizing ultrasound guidance, the patient's femoral head and neck junction was identified and subsequently injected with 4:1 lidocaine:depomedrol via an in-plane approach with ultrasound visualization of the injectate administered into the hip joint. Patient tolerated procedure well without immediate complications.  Procedure, treatment alternatives, risks and benefits explained, specific risks discussed. Consent was given by the patient. Immediately prior to procedure a time out was called to verify the correct patient, procedure, equipment, support staff and site/side marked as required. Patient was prepped and draped in the usual sterile fashion.    Large Joint Inj: L hip joint on 02/10/2023 9:45 AM Indications: pain Details: 22 G 3.5 in needle, ultrasound-guided anterior approach Medications: 4 mL lidocaine 1 %; 40 mg methylPREDNISolone acetate 40 MG/ML Outcome: tolerated well, no immediate complications  Procedure: US-guided intra-articular hip injection, Left After discussion on risks/benefits/indications and informed verbal consent was obtained, a timeout was performed. Patient was lying supine on exam table.  The hip was cleaned with betadine and alcohol swabs. Then utilizing ultrasound guidance, the patient's femoral head and neck junction was identified and subsequently injected with 4:1 lidocaine:depomedrol via an in-plane approach with ultrasound visualization of the injectate administered into the hip joint. Patient tolerated procedure well without immediate complications.  Procedure, treatment alternatives, risks and benefits explained, specific risks discussed. Consent was given by the patient. Immediately prior to procedure a time out was called to verify the correct patient, procedure, equipment, support staff and site/side marked as required. Patient was prepped and draped in the usual sterile fashion.    - I evaluated the patient about 10 minutes post-injection and she had improvement in pain and range of motion - follow-up with Dr. Roda Shutters as indicated; I am happy to see them as needed  Madelyn Brunner, DO Primary Care Sports Medicine Physician  [facility]  This note was dictated using Dragon naturally speaking software and may contain errors in syntax, spelling, or content which have not been identified prior to signing this note.

## 2023-03-03 ENCOUNTER — Ambulatory Visit: Payer: 59 | Admitting: Orthopaedic Surgery

## 2023-03-10 ENCOUNTER — Ambulatory Visit: Payer: 59 | Admitting: Orthopaedic Surgery

## 2023-04-21 ENCOUNTER — Ambulatory Visit: Payer: 59 | Admitting: Internal Medicine

## 2023-05-03 ENCOUNTER — Inpatient Hospital Stay: Admission: RE | Admit: 2023-05-03 | Payer: 59 | Source: Ambulatory Visit

## 2023-06-02 ENCOUNTER — Other Ambulatory Visit: Payer: Self-pay | Admitting: Internal Medicine

## 2023-06-02 ENCOUNTER — Telehealth (INDEPENDENT_AMBULATORY_CARE_PROVIDER_SITE_OTHER): Payer: Self-pay | Admitting: Internal Medicine

## 2023-06-02 NOTE — Telephone Encounter (Signed)
Copied from CRM [id number]. Topic: General - Other >> Jun 02, 2023 10:37 AM Dondra Prader A wrote: Reason for CRM: Pt states that she had a missed phone call from the office on 05/31/2023 and was calling to see what it was in regards to. Please advise.

## 2023-06-02 NOTE — Telephone Encounter (Signed)
Medication Refill - Medication: amLODipine (NORVASC) 10 MG tablet   Has the patient contacted their pharmacy? No.  Pt has not called the pharmacy, per pt she has 10 pills left and wanted to have her refill sent in before she runs out.   Preferred Pharmacy (with phone number or street name): [id number] [place], [place] - [address] AT [facility]  Phone: [phone] Fax: [phone]  Has the patient been seen for an appointment in the last year OR does the patient have an upcoming appointment? Yes.    Agent: Please be advised that RX refills may take up to 3 business days. We ask that you follow-up with your pharmacy.

## 2023-06-03 MED ORDER — AMLODIPINE BESYLATE 10 MG PO TABS
ORAL_TABLET | ORAL | 0 refills | Status: DC
Start: 2023-06-03 — End: 2023-08-04

## 2023-06-03 NOTE — Telephone Encounter (Signed)
Requested Prescriptions  Pending Prescriptions Disp Refills   amLODipine (NORVASC) 10 MG tablet 90 tablet 0    Sig: TAKE 1 TABLET(10 MG) BY MOUTH DAILY     Cardiovascular: Calcium Channel Blockers 2 Failed - 06/02/2023 11:09 AM      Failed - Valid encounter within last 6 months    Recent Outpatient Visits           6 months ago Essential hypertension   [place] [facility] Marcine Matar, MD   1 year ago Primary osteoarthritis of both hips   [facility] Marcine Matar, MD   1 year ago Essential hypertension   [facility] [place], Cornelius Moras, RPH-CPP   1 year ago Encounter for completion of form with patient   [facility] Marcine Matar, MD   1 year ago Essential hypertension   [facility] Drucilla Chalet, RPH-CPP       Future Appointments             In 6 days Madelyn Brunner, DO [place]   In 2 months Marcine Matar, MD [facility]            Passed - Last BP in normal range    BP Readings from Last 1 Encounters:  11/26/22 136/65         Passed - Last Heart Rate in normal range    Pulse Readings from Last 1 Encounters:  11/26/22 70

## 2023-06-08 NOTE — Telephone Encounter (Signed)
No documented phone call per EMR. Possible reminder call for upcoming appointment. Called & spoke to the patient. Informed patient that phone call may have been reminder call for an upcoming appointment within [facility]. Patient expressed verbal understanding. No further questions at this time.

## 2023-06-09 ENCOUNTER — Ambulatory Visit (INDEPENDENT_AMBULATORY_CARE_PROVIDER_SITE_OTHER): Payer: 59 | Admitting: Sports Medicine

## 2023-06-09 DIAGNOSIS — M1612 Unilateral primary osteoarthritis, left hip: Secondary | ICD-10-CM

## 2023-06-09 DIAGNOSIS — M1611 Unilateral primary osteoarthritis, right hip: Secondary | ICD-10-CM

## 2023-06-09 MED ORDER — CELECOXIB 100 MG PO CAPS: 100.0000 mg | ORAL_CAPSULE | Freq: Every day | ORAL | 0 refills | Status: AC

## 2023-06-09 NOTE — Progress Notes (Signed)
Shelley [name] - 68 y.o. female MRN [id number]  Date of birth: [birth year]  Office Visit Note: Visit Date: 06/09/2023 PCP: Marcine Matar, MD Referred by: Marcine Matar, MD  Subjective: Chief Complaint  Patient presents with   Left Hip - Pain   Right Hip - Pain   HPI: Shelley [name] is a pleasant 67 y.o. female who presents today for bilateral hip pain with known osteoarthritis.  Right hip is worse than left hip. Injections back in May - these helped rather significantly, but over the last few weeks her pain has started to recur and become exacerbated.  She is taking Tylenol arthritis 650 mg once to twice daily, helps somewhat.  Pertinent ROS were reviewed with the patient and found to be negative unless otherwise specified above in HPI.   Assessment & Plan: Visit Diagnoses:  1. Primary osteoarthritis of right hip   2. Primary osteoarthritis of left hip    Plan: [name] presents with bilateral hip pain with right hip advanced and moderate left hip osteoarthritis.  She is dealing with an exacerbation of her chronic conditions.  For her short-term pain, we will start her on Celebrex 100 mg once daily to take for the next 2-3 weeks.  She will follow-up at some point next week for ultrasound-guided injections into each hip. We discussed the infrequent nature we can proceed with these.  She is not quite ready for right hip replacement, we will try the above conservative treatments.  She will make an appointment for hip injections.  Follow-up: Return in about 1 week (around 06/16/2023) for schedule next week for *[place] bilateral hip injections (30-min).   Meds & Orders:  Meds ordered this encounter  Medications   celecoxib (CELEBREX) 100 MG capsule    Sig: Take 1 capsule (100 mg total) by mouth daily.    Dispense:  30 capsule    Refill:  0   No orders of the defined types were placed in this encounter.    Procedures: No procedures performed      Clinical History: No  specialty comments available.  She reports that she has been smoking cigarettes. She started smoking about 32 years ago. She has never used smokeless tobacco. No results for input(s): "HGBA1C", "LABURIC" in the last 8760 hours.  Objective:   Vital Signs: LMP  (LMP Unknown)   Physical Exam  Gen: Well-appearing, in no acute distress; non-toxic CV: Well-perfused. Warm.  Resp: Breathing unlabored on room air; no wheezing. Psych: Fluid speech in conversation; appropriate affect; normal thought process Neuro: Sensation intact throughout. No gross coordination deficits.   Ortho Exam -Bilateral hips: No specific bony TTP.  The right hip has about 15 degrees of internal rotation before mechanical block, positive FADIR test.  The left hip moves more fluidly with only about 5-10 degrees less of internal/external rotation.  No redness or swelling noted.  Imaging:  Narrative & Impression  CLINICAL DATA:  Chronic bilateral hip pain.   EXAM: DG HIP (WITH OR WITHOUT PELVIS) 2-3V LEFT; DG HIP (WITH OR WITHOUT PELVIS) 2-3V RIGHT   COMPARISON:  None.   FINDINGS: There is no evidence of hip fracture or dislocation. Moderate degenerative changes are present at the left hip and moderate to severe degenerative changes are noted at the right hip. Degenerative changes are noted in the lower lumbar spine.   IMPRESSION: Moderate to severe degenerative changes at the right hip and moderate degenerative changes at the left hip.     Electronically  Signed   By: Thornell Sartorius M.D.   On: 12/04/2021 01:02    Past Medical/Family/Surgical/Social History: Medications & Allergies reviewed per EMR, new medications updated. Patient Active Problem List   Diagnosis Date Noted   Primary osteoarthritis of right hip 03/02/2022   Primary osteoarthritis of left hip 03/02/2022   Primary osteoarthritis of both hips 02/19/2022   Essential hypertension 09/11/2021   Light smoker 09/11/2021   Hyperlipidemia  09/11/2021   Hip pain, bilateral 09/11/2021   Chronic pain of both knees 09/11/2021   Dyspnea 03/25/2021   CAP (community acquired pneumonia) 07/25/2018   Tobacco use 07/25/2018   Elevated bilirubin 07/25/2018   Alcohol use 07/25/2018   Environmental and seasonal allergies 10/16/2015   Past Medical History:  Diagnosis Date   Allergy    Environmental and Seasonal:  Pharyngeal drainage, cough and eye symptoms   Elevated blood pressure reading without diagnosis of hypertension 2016   Hyperlipidemia    Hypertension    Tobacco use    Family History  Problem Relation Age of Onset   Heart disease Mother    Hypertension Mother    Congestive Heart Failure Mother        Cause of death   Congestive Heart Failure Father    Stroke Father        Cause of death   Hypertension Father    Alcohol abuse Brother    Hypertension Brother    Heart disease Maternal Grandmother    Stroke Maternal Grandmother    Diabetes Sister    Hypertension Sister    Heart disease Sister        Arrhythmia and myocarditis/Congenital Heart Disease/possible arrhythmia   Diabetes Paternal Grandmother    Diabetes Paternal Grandfather    Colitis Neg Hx    Esophageal cancer Neg Hx    Stomach cancer Neg Hx    Rectal cancer Neg Hx    Past Surgical History:  Procedure Laterality Date   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  Looking to get employment with childcare.   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

## 2023-06-09 NOTE — Progress Notes (Signed)
Patient is here for bilateral hip pain. She stated that injections helped but she started experiencing pain about 3 weeks ago. Pain is worse in right than left. Patient is hoping for injections today.

## 2023-06-10 ENCOUNTER — Encounter: Payer: Self-pay | Admitting: Sports Medicine

## 2023-06-17 ENCOUNTER — Ambulatory Visit: Payer: 59 | Admitting: Sports Medicine

## 2023-07-01 IMAGING — CR DG HIP (WITH OR WITHOUT PELVIS) 2-3V*R*
3 series · 3 of 3 positions shown · non-contrast
Comparison: None.

CLINICAL DATA: Chronic bilateral hip pain.

EXAM:
DG HIP (WITH OR WITHOUT PELVIS) 2-3V LEFT; DG HIP (WITH OR WITHOUT
PELVIS) 2-3V RIGHT

[pelvis ap]
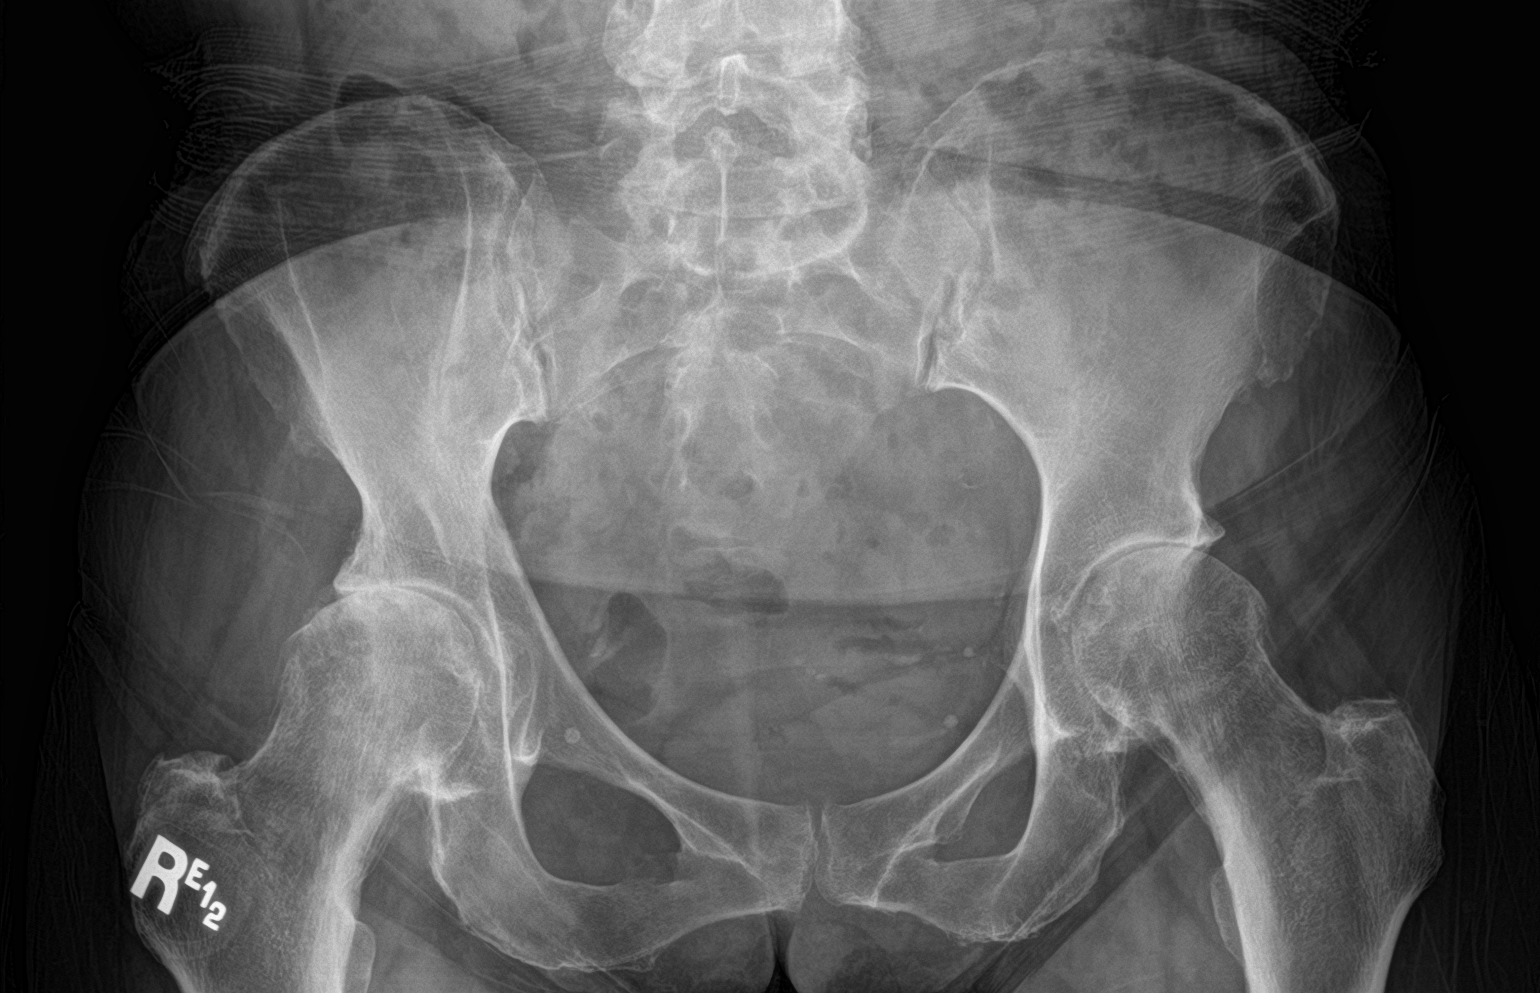

[hip ap]
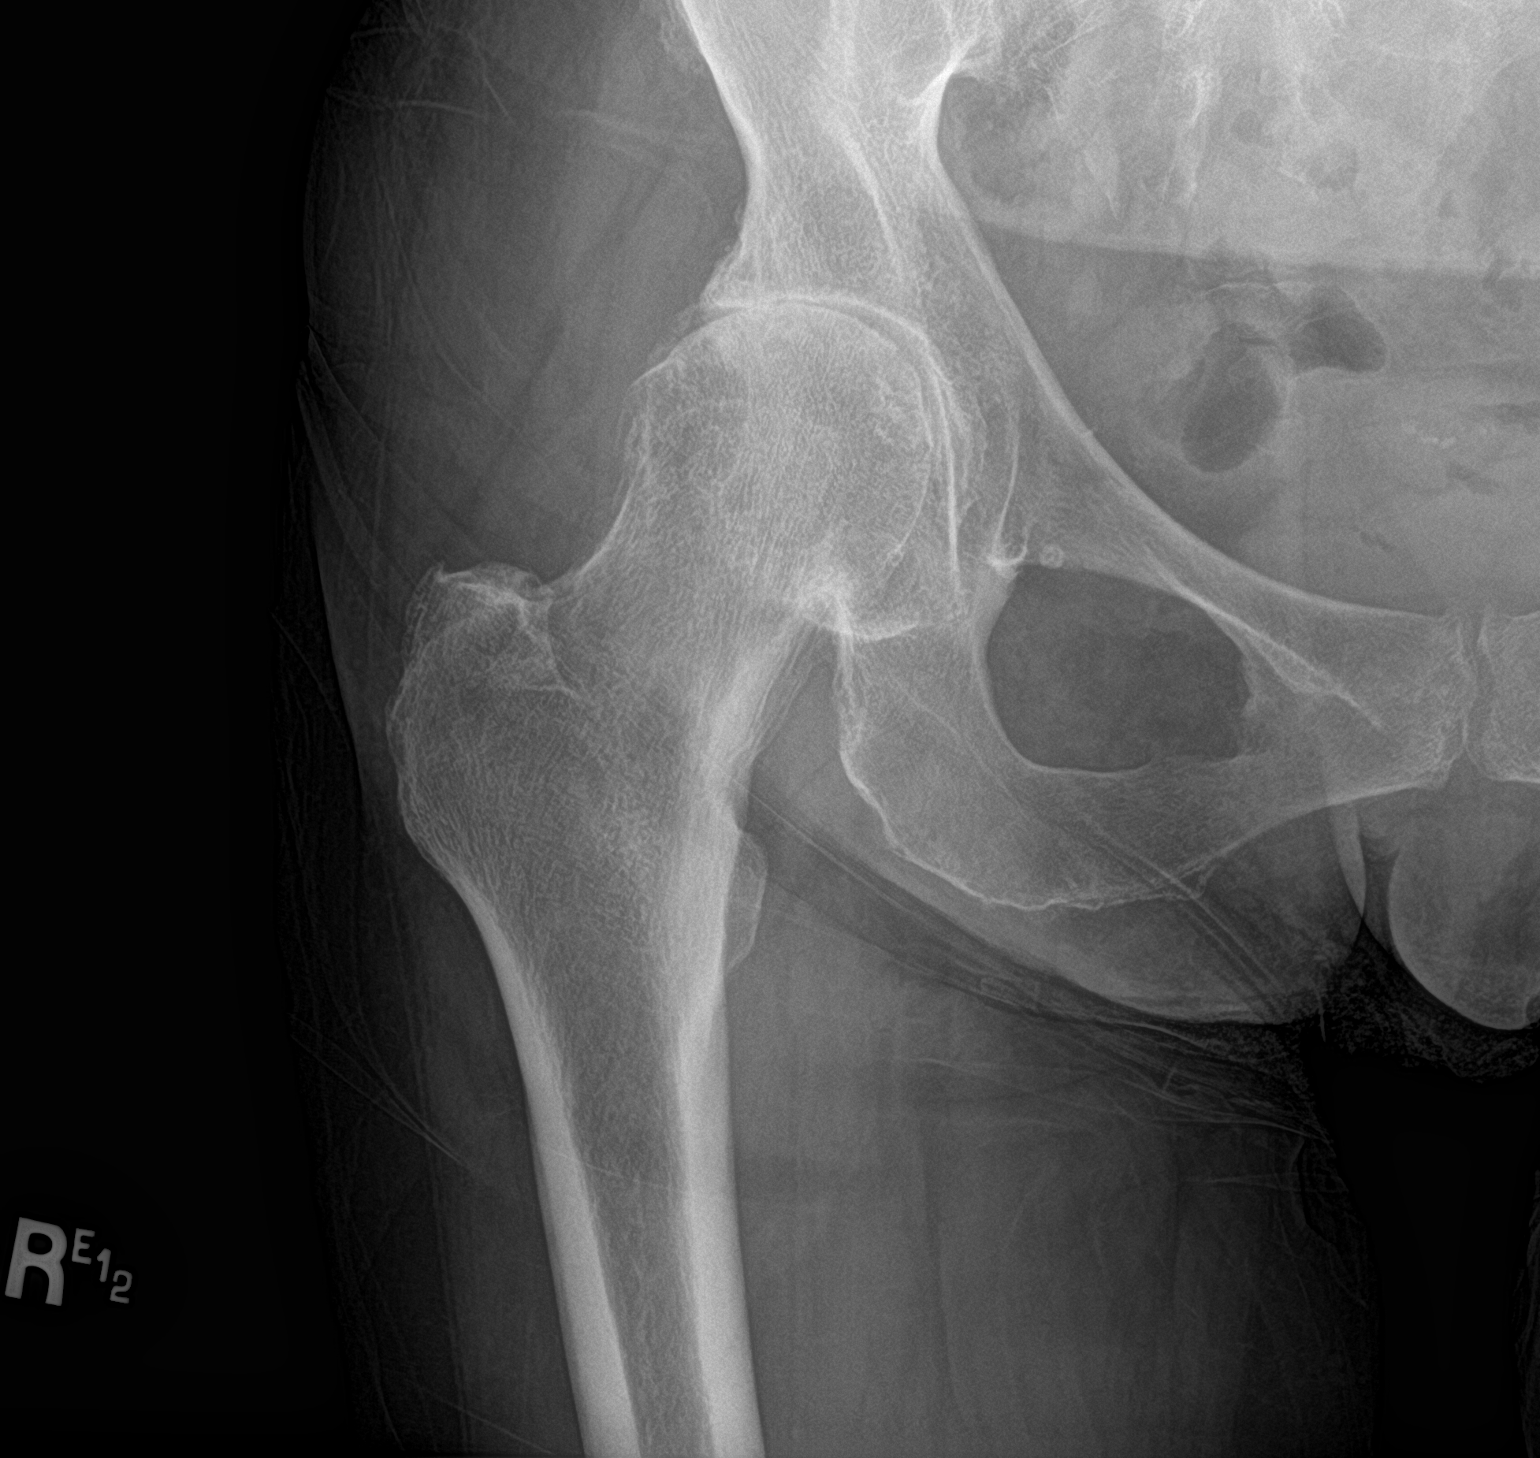

[hip lat]
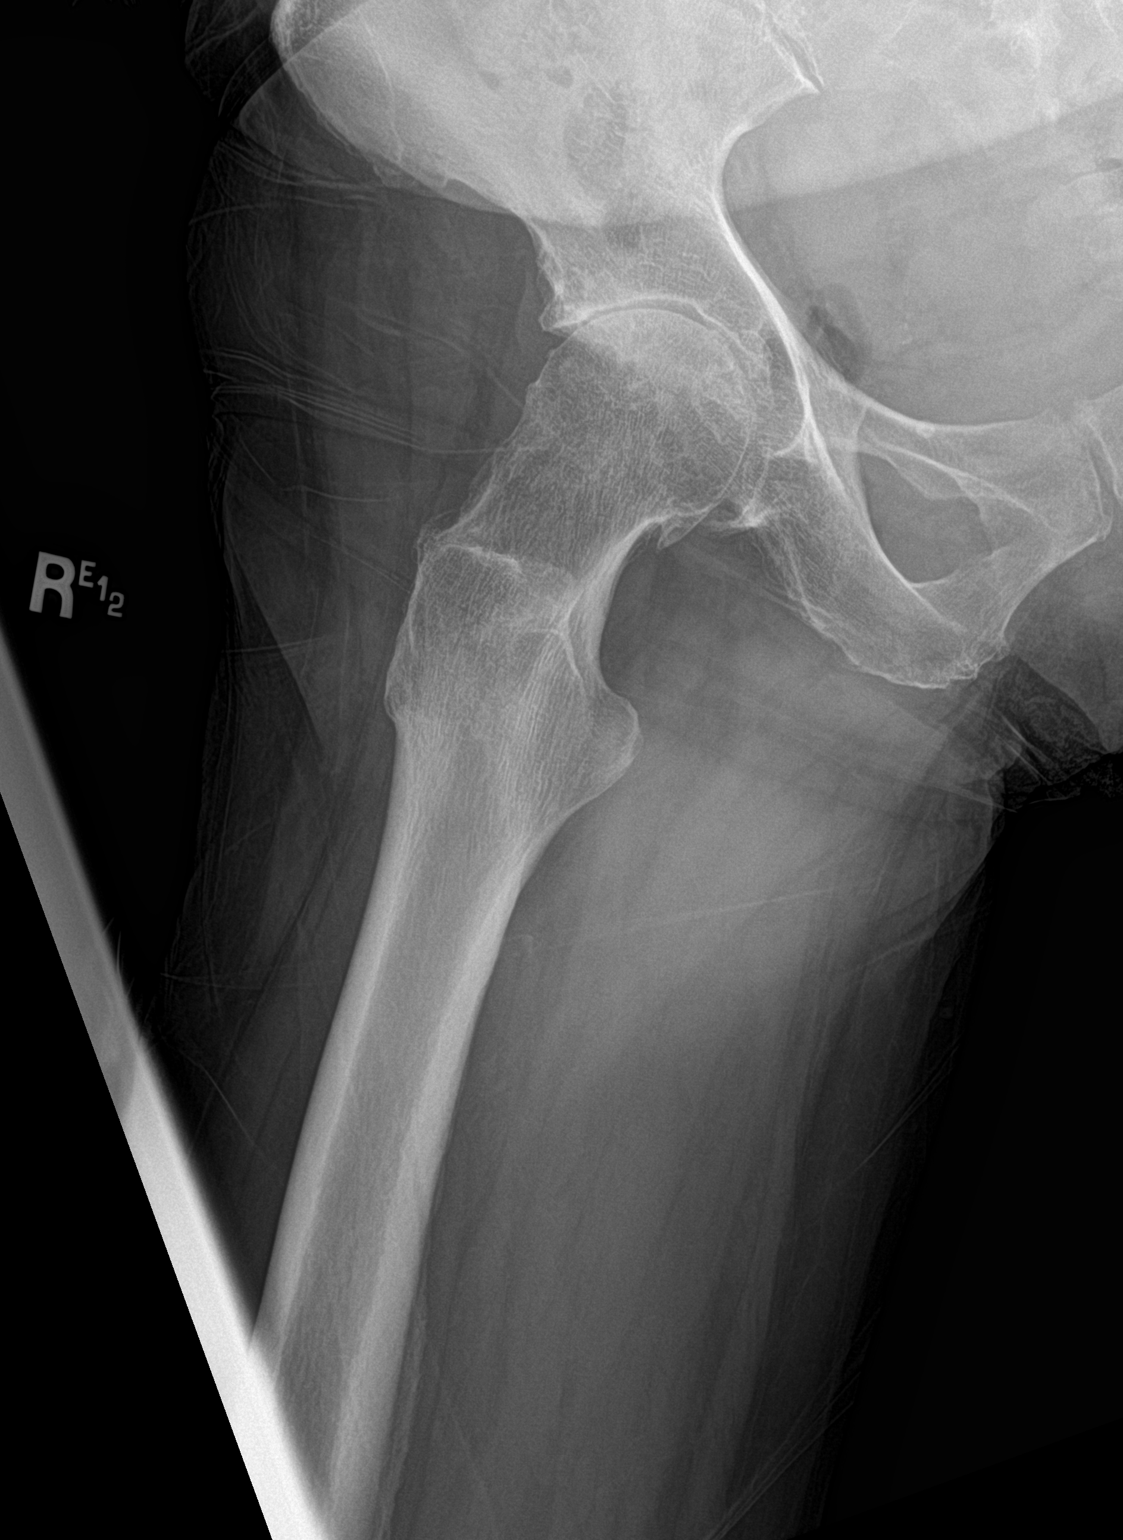

[3 of 3 positions shown; findings below may reference images not displayed]

FINDINGS: There is no evidence of hip fracture or dislocation. Moderate
degenerative changes are present at the left hip and moderate to
severe degenerative changes are noted at the right hip. Degenerative
changes are noted in the lower lumbar spine.
IMPRESSION: Moderate to severe degenerative changes at the right hip and
moderate degenerative changes at the left hip.

## 2023-07-09 ENCOUNTER — Other Ambulatory Visit: Payer: Self-pay | Admitting: Sports Medicine

## 2023-08-04 ENCOUNTER — Encounter: Payer: Self-pay | Admitting: Internal Medicine

## 2023-08-04 ENCOUNTER — Ambulatory Visit: Payer: 59 | Attending: Internal Medicine | Admitting: Internal Medicine

## 2023-08-04 VITALS — BP 130/60 | HR 77 | Temp 98.7°F | Ht 67.0 in | Wt 117.0 lb

## 2023-08-04 DIAGNOSIS — I10 Essential (primary) hypertension: Secondary | ICD-10-CM | POA: Diagnosis not present

## 2023-08-04 DIAGNOSIS — F17210 Nicotine dependence, cigarettes, uncomplicated: Secondary | ICD-10-CM | POA: Diagnosis not present

## 2023-08-04 DIAGNOSIS — R0681 Apnea, not elsewhere classified: Secondary | ICD-10-CM

## 2023-08-04 DIAGNOSIS — F17200 Nicotine dependence, unspecified, uncomplicated: Secondary | ICD-10-CM

## 2023-08-04 DIAGNOSIS — Z2821 Immunization not carried out because of patient refusal: Secondary | ICD-10-CM

## 2023-08-04 DIAGNOSIS — Z1231 Encounter for screening mammogram for malignant neoplasm of breast: Secondary | ICD-10-CM

## 2023-08-04 DIAGNOSIS — R0683 Snoring: Secondary | ICD-10-CM | POA: Diagnosis not present

## 2023-08-04 DIAGNOSIS — E785 Hyperlipidemia, unspecified: Secondary | ICD-10-CM

## 2023-08-04 DIAGNOSIS — Z789 Other specified health status: Secondary | ICD-10-CM

## 2023-08-04 MED ORDER — NICOTINE POLACRILEX 2 MG MT GUM: 2.0000 mg | CHEWING_GUM | OROMUCOSAL | 3 refills | Status: DC | PRN

## 2023-08-04 MED ORDER — VALSARTAN 40 MG PO TABS: 40.0000 mg | ORAL_TABLET | Freq: Every day | ORAL | 2 refills | Status: DC

## 2023-08-04 MED ORDER — PRAVASTATIN SODIUM 10 MG PO TABS: ORAL_TABLET | ORAL | 2 refills | Status: AC

## 2023-08-04 MED ORDER — AMLODIPINE BESYLATE 10 MG PO TABS: ORAL_TABLET | ORAL | 2 refills | Status: DC

## 2023-08-04 NOTE — Patient Instructions (Signed)
Stop rosuvastatin.  We will try you with the pravastatin instead.  Let me know if it causes any muscle aches or cramps.  Try to cut back to no more than 1 mixed drink a day.

## 2023-08-04 NOTE — Progress Notes (Signed)
Patient ID: Shelley [name], female    DOB: 12-May-1956  MRN: [id number]  CC: Hypertension (HTN f/u. Med refill./No questions / concerns /No to flu & Pneumonia vax. Yes to mammogram referral/)   Subjective: Shelley [name] is a 67 y.o. female who presents for chronic ds management. Her concerns today include:  Patient with history of HTN, HL, tob dep, EtOH use, allergies, OA hips   HTN: Reports compliance with Norvasc 10 mg daily and valsartan 40 mg daily.  Take meds at nights.  BP elev today.  Attributes it to diet especially salt foods.  Not eating as much fresh fruits and veggies  HL: Supposed to be on Crestor 3 times a week. She states she was having pain with this dosing so she stopped taking it.   Tobacco dependence: On last visit she was smoking a quarter of a pack a day. Down to 2 cigarettes a day.  Trying to quit  On last visit with me, we referred for home sleep study.  Patient was told by her daughter that she snores very loud and that she stops breathing in her sleep intermittently through the night.  She was waking up unrefreshed and is still doing so.  She endorses daytime sleepiness and tiredness.   Drinks water during the night because throat gets too dry from snoring.  Drinks about two 16 oz at nights.   Reports nocturia because of it.  ETOH use:  at 1-2 mix drink a day.   Patient Active Problem List   Diagnosis Date Noted   Primary osteoarthritis of right hip 03/02/2022   Primary osteoarthritis of left hip 03/02/2022   Primary osteoarthritis of both hips 02/19/2022   Essential hypertension 09/11/2021   Light smoker 09/11/2021   Hyperlipidemia 09/11/2021   Hip pain, bilateral 09/11/2021   Chronic pain of both knees 09/11/2021   Dyspnea 03/25/2021   CAP (community acquired pneumonia) 07/25/2018   Tobacco use 07/25/2018   Elevated bilirubin 07/25/2018   Alcohol use 07/25/2018   Environmental and seasonal allergies 10/16/2015     Current Outpatient Medications on  File Prior to Visit  Medication Sig Dispense Refill   fluticasone (FLONASE) 50 MCG/ACT nasal spray Place 1 spray into both nostrils daily.     aspirin EC 81 MG tablet Take 81 mg by mouth daily. Swallow whole. (Patient not taking: Reported on 08/04/2023)     Vitamin D, Ergocalciferol, (DRISDOL) 1.25 MG (50000 UNIT) CAPS capsule Take 1 capsule (50,000 Units total) by mouth every 7 (seven) days. (Patient not taking: Reported on 08/04/2023) 12 capsule 1   No current facility-administered medications on file prior to visit.    Allergies  Allergen Reactions   Meloxicam     Stomach pain   Metronidazole Nausea And Vomiting   Cephalexin Diarrhea    Developed diarrhea after only 2 doses.   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   Went to school for speech pathology and communication--spent time at [employer] and [employer]--2 years of college   Lives with 2 daughters, ages 39 and 3 yo.   Social Determinants of Health   Financial Resource Strain: Low Risk  (11/18/2022)   Overall Financial Resource  Strain (CARDIA)    Difficulty of Paying Living Expenses: Not hard at all  Food Insecurity: No Food Insecurity (11/18/2022)   Hunger Vital Sign    Worried About Running Out of Food in the Last Year: Never true    Ran Out of Food in the Last Year: Never true  Transportation Needs: No Transportation Needs (11/18/2022)   PRAPARE - Administrator, Civil Service (Medical): No    Lack of Transportation (Non-Medical): No  Physical Activity: Inactive (11/18/2022)   Exercise Vital Sign    Days of Exercise per Week: 0 days    Minutes of Exercise per Session: 0 min  Stress: No Stress Concern Present (11/18/2022)   [employer] of Occupational Health - Occupational Stress Questionnaire    Feeling of Stress : Not at all  Social Connections: Not on file  Intimate Partner Violence: Not on file    Family History  Problem Relation Age of Onset   Heart disease Mother    Hypertension Mother    Congestive Heart Failure Mother        Cause of death   Congestive Heart Failure Father    Stroke Father        Cause of death   Hypertension Father    Alcohol abuse Brother    Hypertension Brother    Heart disease Maternal Grandmother    Stroke Maternal Grandmother    Diabetes Sister    Hypertension Sister    Heart disease Sister        Arrhythmia and myocarditis/Congenital Heart Disease/possible arrhythmia   Diabetes Paternal Grandmother    Diabetes Paternal Grandfather    Colitis Neg Hx    Esophageal cancer Neg Hx    Stomach cancer Neg Hx    Rectal cancer Neg Hx     Past Surgical History:  Procedure Laterality Date   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    ROS: Review of Systems Negative except as stated above  PHYSICAL EXAM: BP 130/60   Pulse 77   Temp 98.7 F (37.1 C) (Oral)   Ht 5\' 7"  (1.702 m)   Wt 117 lb (53.1 kg)   LMP  (LMP Unknown)   SpO2 98%   BMI 18.32 kg/m   Wt Readings from Last 3 Encounters:  08/04/23 117 lb (53.1 kg)   11/26/22 122 lb (55.3 kg)  11/18/22 126 lb (57.2 kg)    Physical Exam  General appearance - alert, well appearing, and in no distress Mental status - normal mood, behavior, speech, dress, motor activity, and thought processes Neck - supple, no significant adenopathy Chest - clear to auscultation, no wheezes, rales  or rhonchi, symmetric air entry Heart - normal rate, regular rhythm, normal S1, S2, no murmurs, rubs, clicks or gallops Extremities - peripheral pulses normal, no pedal edema, no clubbing or cyanosis      Latest Ref Rng & Units 11/26/2022    3:20 PM 11/23/2021    4:02 PM 11/03/2021    3:48 PM  CMP  Glucose 70 - 99 mg/dL 86  347  425   BUN 8 - 27 mg/dL 15  19  21    Creatinine 0.57 - 1.00 mg/dL 9.56  3.87  5.64   Sodium 134 - 144 mmol/L 141  142  142   Potassium 3.5 - 5.2 mmol/L 4.3  4.2  4.3   Chloride 96 - 106 mmol/L 101  105  104   CO2 20 - 29 mmol/L 25  26  25    Calcium 8.7 - 10.3 mg/dL 33.2  9.6  9.5   Total Protein 6.0 - 8.5 g/dL 7.5     Total Bilirubin 0.0 - 1.2 mg/dL 0.6     Alkaline Phos 44 - 121 IU/L 98     AST 0 - 40 IU/L 13     ALT 0 - 32 IU/L 16      Lipid Panel     Component Value Date/Time   CHOL 235 (H) 11/26/2022 1520   TRIG 150 (H) 11/26/2022 1520   HDL 65 11/26/2022 1520   CHOLHDL 3.6 11/26/2022 1520   LDLCALC 143 (H) 11/26/2022 1520    CBC    Component Value Date/Time   WBC 7.1 11/26/2022 1520   WBC 7.5 10/31/2018 1828   RBC 4.40 11/26/2022 1520   RBC 5.00 10/31/2018 1828   HGB 13.8 11/26/2022 1520   HCT 40.8 11/26/2022 1520   PLT 246 11/26/2022 1520   MCV 93 11/26/2022 1520   MCH 31.4 11/26/2022 1520   MCH 31.4 10/31/2018 1828   MCHC 33.8 11/26/2022 1520   MCHC 34.0 10/31/2018 1828   RDW 13.5 11/26/2022 1520   LYMPHSABS 2.2 09/11/2021 1017   MONOABS 1.2 (H) 10/29/2018 1725   EOSABS 0.2 09/11/2021 1017   BASOSABS 0.1 09/11/2021 1017    ASSESSMENT AND PLAN: 1. Essential hypertension Repeat blood pressure XU at goal.   Continue amlodipine 10 mg daily and Diovan 40 mg daily - amLODipine (NORVASC) 10 MG tablet; TAKE 1 TABLET(10 MG) BY MOUTH DAILY  Dispense: 90 tablet; Refill: 2 - valsartan (DIOVAN) 40 MG tablet; Take 1 tablet (40 mg total) by mouth daily.  Dispense: 90 tablet; Refill: 2 - CBC - Comprehensive metabolic panel  2. Hyperlipidemia, unspecified hyperlipidemia type We discussed trying her with a different statin medication.  She will stop Crestor.  Changed to pravastatin instead.  We will have her take it 3 times a week for starters. - pravastatin (PRAVACHOL) 10 MG tablet; Take 1 tab PO Q Mon/Wed/Fri  Dispense: 36 tablet; Refill: 2 - Lipid panel  3. Tobacco dependence Advised to quit.  Patient states that she is working on quitting.  She would like to try the nicotine gum. - nicotine polacrilex (NICORETTE) 2 MG gum; Take 1 each (2 mg total) by mouth as needed for smoking cessation.  Dispense: 100 tablet; Refill: 3  4. Loud snoring Symptoms still very suggestive of sleep apnea.  We will reorder the sleep study - Home sleep test; Future  5. Witnessed episode of apnea - Home sleep test; Future  6. Alcohol use Advised and encouraged her to cut back to no more than  1 mixed drink a day.  7. Influenza vaccination declined   8. Pneumococcal vaccination declined   9. Encounter for screening mammogram for malignant neoplasm of breast - MM Digital Screening; Future   Patient was given the opportunity to ask questions.  Patient verbalized understanding of the plan and was able to repeat key elements of the plan.   This documentation was completed using Paediatric nurse.  Any transcriptional errors are unintentional.  Orders Placed This Encounter  Procedures   MM Digital Screening   CBC   Comprehensive metabolic panel   Lipid panel   Home sleep test     Requested Prescriptions   Signed Prescriptions Disp Refills   amLODipine (NORVASC) 10 MG tablet 90 tablet 2     Sig: TAKE 1 TABLET(10 MG) BY MOUTH DAILY   valsartan (DIOVAN) 40 MG tablet 90 tablet 2    Sig: Take 1 tablet (40 mg total) by mouth daily.   pravastatin (PRAVACHOL) 10 MG tablet 36 tablet 2    Sig: Take 1 tab PO Q Mon/Wed/Fri   nicotine polacrilex (NICORETTE) 2 MG gum 100 tablet 3    Sig: Take 1 each (2 mg total) by mouth as needed for smoking cessation.    Return in about 4 months (around 12/02/2023).  Jonah Blue, MD, FACP

## 2023-08-05 LAB — COMPREHENSIVE METABOLIC PANEL
ALT: 16 [IU]/L (ref 0–32)
AST: 17 [IU]/L (ref 0–40)
Albumin: 4.9 g/dL (ref 3.9–4.9)
Alkaline Phosphatase: 97 [IU]/L (ref 44–121)
BUN/Creatinine Ratio: 20 (ref 12–28)
BUN: 15 mg/dL (ref 8–27)
Bilirubin Total: 0.5 mg/dL (ref 0.0–1.2)
CO2: 25 mmol/L (ref 20–29)
Calcium: 10.8 mg/dL — ABNORMAL HIGH (ref 8.7–10.3)
Chloride: 105 mmol/L (ref 96–106)
Creatinine, Ser: 0.74 mg/dL (ref 0.57–1.00)
Globulin, Total: 3 g/dL (ref 1.5–4.5)
Glucose: 189 mg/dL — ABNORMAL HIGH (ref 70–99)
Potassium: 5.2 mmol/L (ref 3.5–5.2)
Sodium: 146 mmol/L — ABNORMAL HIGH (ref 134–144)
Total Protein: 7.9 g/dL (ref 6.0–8.5)
eGFR: 88 mL/min/{1.73_m2} (ref >59)

## 2023-08-05 LAB — CBC
Hematocrit: 45 % (ref 34.0–46.6)
Hemoglobin: 15.3 g/dL (ref 11.1–15.9)
MCH: 32.1 pg (ref 26.6–33.0)
MCHC: 34 g/dL (ref 31.5–35.7)
MCV: 95 fL (ref 79–97)
Platelets: 240 10*3/uL (ref 150–450)
RBC: 4.76 x10E6/uL (ref 3.77–5.28)
RDW: 13 % (ref 11.7–15.4)
WBC: 5.5 10*3/uL (ref 3.4–10.8)

## 2023-08-05 LAB — LIPID PANEL
Chol/HDL Ratio: 3.9 ratio (ref 0.0–4.4)
Cholesterol, Total: 280 mg/dL — ABNORMAL HIGH (ref 100–199)
HDL: 72 mg/dL (ref >39)
LDL Chol Calc (NIH): 178 mg/dL — ABNORMAL HIGH (ref 0–99)
Triglycerides: 166 mg/dL — ABNORMAL HIGH (ref 0–149)
VLDL Cholesterol Cal: 30 mg/dL (ref 5–40)

## 2023-08-06 ENCOUNTER — Other Ambulatory Visit: Payer: Self-pay | Admitting: Internal Medicine

## 2023-08-06 DIAGNOSIS — R739 Hyperglycemia, unspecified: Secondary | ICD-10-CM

## 2023-08-11 ENCOUNTER — Other Ambulatory Visit: Payer: 59

## 2023-08-15 ENCOUNTER — Other Ambulatory Visit: Payer: 59

## 2023-08-18 ENCOUNTER — Encounter: Payer: Self-pay | Admitting: Sports Medicine

## 2023-08-18 ENCOUNTER — Other Ambulatory Visit: Payer: Self-pay

## 2023-08-18 ENCOUNTER — Other Ambulatory Visit (HOSPITAL_COMMUNITY): Payer: Self-pay

## 2023-08-18 ENCOUNTER — Ambulatory Visit: Payer: 59 | Admitting: Sports Medicine

## 2023-08-18 DIAGNOSIS — M1611 Unilateral primary osteoarthritis, right hip: Secondary | ICD-10-CM

## 2023-08-18 DIAGNOSIS — M25551 Pain in right hip: Secondary | ICD-10-CM

## 2023-08-18 DIAGNOSIS — M1612 Unilateral primary osteoarthritis, left hip: Secondary | ICD-10-CM

## 2023-08-18 DIAGNOSIS — G8929 Other chronic pain: Secondary | ICD-10-CM

## 2023-08-18 DIAGNOSIS — M25552 Pain in left hip: Secondary | ICD-10-CM

## 2023-08-18 MED ORDER — METHYLPREDNISOLONE ACETATE 40 MG/ML IJ SUSP
40.0000 mg | INTRAMUSCULAR | Status: AC | PRN
  Administered 2023-08-18: 40 mg via INTRA_ARTICULAR

## 2023-08-18 MED ORDER — LIDOCAINE HCL 1 % IJ SOLN
4.0000 mL | INTRAMUSCULAR | Status: AC | PRN
  Administered 2023-08-18: 4 mL

## 2023-08-18 MED ORDER — CELECOXIB 100 MG PO CAPS: 100.0000 mg | ORAL_CAPSULE | Freq: Every day | ORAL | 0 refills | Status: DC

## 2023-08-18 MED ORDER — CELECOXIB 100 MG PO CAPS
100.0000 mg | ORAL_CAPSULE | Freq: Every day | ORAL | 0 refills | Status: DC
  Filled 2023-08-18: qty 30, 30d supply, fill #0

## 2023-08-18 NOTE — Progress Notes (Signed)
Shelley [name] - 67 y.o. female MRN [id number]  Date of birth: [birth year]  Office Visit Note: Visit Date: 08/18/2023 PCP: Marcine Matar, MD Referred by: Marcine Matar, MD  Subjective: Chief Complaint  Patient presents with   Right Hip - Pain    Injection   Left Hip - Pain    Injection   HPI: Shelley [name] is a pleasant 67 y.o. female who presents today for bilateral hip pain with known osteoarthritis.  [name] presents with chronic bilateral hip pain, although right is worse than left.  Her osteoarthritis is also worse on the right compared to the left.  We did perform bilateral intra-articular hip injections under ultrasound guidance back in May which gave her good relief for many months.  Over the last 1-2 months or so her pain has been exacerbated.  She does have a few tablets of Celebrex 100 mg which she will take daily only as needed, uses infrequently but does help.  She notes her right hip is rather severe, she is not interested in a replacement at this time but may consider this next year depending on her relief from these injections.  Pertinent ROS were reviewed with the patient and found to be negative unless otherwise specified above in HPI.   Assessment & Plan: Visit Diagnoses:  1. Primary osteoarthritis of left hip   2. Primary osteoarthritis of right hip   3. Chronic hip pain, bilateral    Plan: Impression is bilateral hip osteoarthritis that is rather advanced, specifically on the right greater than left hip.  She received excellent relief of her pain with injections back in May, but over the last 1-2 months.  Pain and arthritis has been exacerbated.  Through shared decision making, we did proceed with ultrasound-guided intra-articular hip injection bilaterally.  She will use ice/heat for any postinjection pain.  I did refill her Celebrex that she can take 100 mg once daily as needed, and may use infrequently.  She will follow-up in about 3 months as needed.  Continue to stay active with walking and other activity.   Follow-up: Return in about 3 months (around 11/18/2023), or if symptoms worsen or fail to improve.   Meds & Orders:  Meds ordered this encounter  Medications   celecoxib (CELEBREX) 100 MG capsule    Sig: Take 1 capsule (100 mg total) by mouth daily.    Dispense:  30 capsule    Refill:  0    Orders Placed This Encounter  Procedures   Large Joint Inj: L hip joint   Large Joint Inj: R hip joint   US Guided Needle Placement - No Linked Charges     Procedures: Large Joint Inj: L hip joint on 08/18/2023 8:43 AM Indications: pain Details: 22 G 3.5 in needle, ultrasound-guided anterior approach Medications: 4 mL lidocaine 1 %; 40 mg methylPREDNISolone acetate 40 MG/ML Outcome: tolerated well, no immediate complications  Procedure: US-guided intra-articular hip injection, left After discussion on risks/benefits/indications and informed verbal consent was obtained, a timeout was performed. Patient was lying supine on exam table. The hip was cleaned with betadine and alcohol swabs. Then utilizing ultrasound guidance, the patient's femoral head and neck junction was identified and subsequently injected with 4:1 lidocaine:depomedrol via an in-plane approach with ultrasound visualization of the injectate administered into the hip joint. Patient tolerated procedure well without immediate complications.  Procedure, treatment alternatives, risks and benefits explained, specific risks discussed. Consent was given by the patient. Immediately prior to procedure  a time out was called to verify the correct patient, procedure, equipment, support staff and site/side marked as required. Patient was prepped and draped in the usual sterile fashion.    Large Joint Inj: R hip joint on 08/18/2023 8:43 AM Indications: pain Details: 22 G 3.5 in needle, ultrasound-guided anterior approach Medications: 4 mL lidocaine 1 %; 40 mg methylPREDNISolone acetate 40  MG/ML Outcome: tolerated well, no immediate complications  Procedure: US-guided intra-articular hip injection, right After discussion on risks/benefits/indications and informed verbal consent was obtained, a timeout was performed. Patient was lying supine on exam table. The hip was cleaned with betadine and alcohol swabs. Then utilizing ultrasound guidance, the patient's femoral head and neck junction was identified and subsequently injected with 4:1 lidocaine:depomedrol via an in-plane approach with ultrasound visualization of the injectate administered into the hip joint. Patient tolerated procedure well without immediate complications.  Procedure, treatment alternatives, risks and benefits explained, specific risks discussed. Consent was given by the patient. Immediately prior to procedure a time out was called to verify the correct patient, procedure, equipment, support staff and site/side marked as required. Patient was prepped and draped in the usual sterile fashion.          Clinical History: No specialty comments available.  She reports that she has been smoking cigarettes. She started smoking about 32 years ago. She has never used smokeless tobacco. No results for input(s): "HGBA1C", "LABURIC" in the last 8760 hours.  Objective:   Vital Signs: LMP  (LMP Unknown)   Physical Exam  Gen: Well-appearing, in no acute distress; non-toxic CV: Well-perfused. Warm.  Resp: Breathing unlabored on room air; no wheezing. Psych: Fluid speech in conversation; appropriate affect; normal thought process Neuro: Sensation intact throughout. No gross coordination deficits.   Ortho Exam - Bilateral hips: There is no bony TTP, no redness swelling or effusion.  The right hip has marked limitation of motion with both internal and external logroll, the left hip has limitation more so with internal logroll.  Positive FADIR test right greater than left.  Imaging:  *Right and left hip XR from  12/02/21: Narrative & Impression  CLINICAL DATA:  Chronic bilateral hip pain.   EXAM: DG HIP (WITH OR WITHOUT PELVIS) 2-3V LEFT; DG HIP (WITH OR WITHOUT PELVIS) 2-3V RIGHT   COMPARISON:  None.   FINDINGS: There is no evidence of hip fracture or dislocation. Moderate degenerative changes are present at the left hip and moderate to severe degenerative changes are noted at the right hip. Degenerative changes are noted in the lower lumbar spine.   IMPRESSION: Moderate to severe degenerative changes at the right hip and moderate degenerative changes at the left hip.     Electronically Signed   By: Thornell Sartorius M.D.   On: 12/04/2021 01:02    Past Medical/Family/Surgical/Social History: Medications & Allergies reviewed per EMR, new medications updated. Patient Active Problem List   Diagnosis Date Noted   Primary osteoarthritis of right hip 03/02/2022   Primary osteoarthritis of left hip 03/02/2022   Primary osteoarthritis of both hips 02/19/2022   Essential hypertension 09/11/2021   Light smoker 09/11/2021   Hyperlipidemia 09/11/2021   Hip pain, bilateral 09/11/2021   Chronic pain of both knees 09/11/2021   Dyspnea 03/25/2021   CAP (community acquired pneumonia) 07/25/2018   Tobacco use 07/25/2018   Elevated bilirubin 07/25/2018   Alcohol use 07/25/2018   Environmental and seasonal allergies 10/16/2015   Past Medical History:  Diagnosis Date   Allergy    Environmental  and Seasonal:  Pharyngeal drainage, cough and eye symptoms   Elevated blood pressure reading without diagnosis of hypertension 2016   Hyperlipidemia    Hypertension    Tobacco use    Family History  Problem Relation Age of Onset   Heart disease Mother    Hypertension Mother    Congestive Heart Failure Mother        Cause of death   Congestive Heart Failure Father    Stroke Father        Cause of death   Hypertension Father    Alcohol abuse Brother    Hypertension Brother    Heart disease  Maternal Grandmother    Stroke Maternal Grandmother    Diabetes Sister    Hypertension Sister    Heart disease Sister        Arrhythmia and myocarditis/Congenital Heart Disease/possible arrhythmia   Diabetes Paternal Grandmother    Diabetes Paternal Grandfather    Colitis Neg Hx    Esophageal cancer Neg Hx    Stomach cancer Neg Hx    Rectal cancer Neg Hx    Past Surgical History:  Procedure Laterality Date   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  Looking to get employment with childcare.   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

## 2023-08-18 NOTE — Addendum Note (Signed)
Addended by: Jamie Kato III on: 08/18/2023 09:58 AM   Modules accepted: Orders

## 2023-08-19 ENCOUNTER — Telehealth: Payer: Self-pay | Admitting: Sports Medicine

## 2023-08-19 NOTE — Telephone Encounter (Signed)
Pt called this morning stating she got two hip injections yesterday. She wants to know if she can take osteo bi flex today or should she wait a couple days in between the injections?

## 2023-09-20 ENCOUNTER — Other Ambulatory Visit: Payer: Self-pay | Admitting: Sports Medicine

## 2023-09-23 ENCOUNTER — Ambulatory Visit: Payer: 59

## 2023-10-04 ENCOUNTER — Ambulatory Visit: Payer: 59

## 2023-10-20 ENCOUNTER — Ambulatory Visit: Payer: 59

## 2023-11-02 ENCOUNTER — Ambulatory Visit: Payer: 59

## 2023-11-04 ENCOUNTER — Ambulatory Visit (HOSPITAL_BASED_OUTPATIENT_CLINIC_OR_DEPARTMENT_OTHER): Payer: 59 | Attending: Internal Medicine | Admitting: Internal Medicine

## 2023-11-07 ENCOUNTER — Ambulatory Visit: Payer: 59

## 2023-12-06 ENCOUNTER — Ambulatory Visit: Payer: 59 | Admitting: Internal Medicine

## 2024-02-07 ENCOUNTER — Other Ambulatory Visit: Payer: Self-pay | Admitting: Internal Medicine

## 2024-02-07 DIAGNOSIS — I10 Essential (primary) hypertension: Secondary | ICD-10-CM

## 2024-02-07 MED ORDER — AMLODIPINE BESYLATE 10 MG PO TABS: ORAL_TABLET | ORAL | 0 refills | Status: DC

## 2024-02-14 ENCOUNTER — Ambulatory Visit: Admitting: Orthopaedic Surgery

## 2024-02-14 ENCOUNTER — Ambulatory Visit: Admitting: Sports Medicine

## 2024-02-20 NOTE — Progress Notes (Deleted)
 Office Visit Note   Patient: Shelley [name]           Date of Birth: 1956-03-01           MRN: [id number] Visit Date: 02/21/2024              Requested by: Lawrance Presume, MD [address] [place],  [place] [address] PCP: Lawrance Presume, MD   Assessment & Plan: Visit Diagnoses:  1. Primary osteoarthritis of right hip     Plan: ***  Follow-Up Instructions: No follow-ups on file.   Orders:  No orders of the defined types were placed in this encounter.  No orders of the defined types were placed in this encounter.   Subjective: No chief complaint on file.   Objective: Vital Signs: LMP  (LMP Unknown)   Specialty Comments:  No specialty comments available.  Imaging: No results found.   PMFS History: Patient Active Problem List   Diagnosis Date Noted   Primary osteoarthritis of right hip 03/02/2022   Primary osteoarthritis of left hip 03/02/2022   Primary osteoarthritis of both hips 02/19/2022   Essential hypertension 09/11/2021   Light smoker 09/11/2021   Hyperlipidemia 09/11/2021   Hip pain, bilateral 09/11/2021   Chronic pain of both knees 09/11/2021   Dyspnea 03/25/2021   CAP (community acquired pneumonia) 07/25/2018   Tobacco use 07/25/2018   Elevated bilirubin 07/25/2018   Alcohol use 07/25/2018   Environmental and seasonal allergies 10/16/2015   Past Medical History:  Diagnosis Date   Allergy    Environmental and Seasonal:  Pharyngeal drainage, cough and eye symptoms   Elevated blood pressure reading without diagnosis of hypertension 2016   Hyperlipidemia    Hypertension    Tobacco use     Family History  Problem Relation Age of Onset   Heart disease Mother    Hypertension Mother    Congestive Heart Failure Mother        Cause of death   Congestive Heart Failure Father    Stroke Father        Cause of death   Hypertension Father    Alcohol abuse Brother    Hypertension Brother    Heart disease Maternal Grandmother     Stroke Maternal Grandmother    Diabetes Sister    Hypertension Sister    Heart disease Sister        Arrhythmia and myocarditis/Congenital Heart Disease/possible arrhythmia   Diabetes Paternal Grandmother    Diabetes Paternal Grandfather    Colitis Neg Hx    Esophageal cancer Neg Hx    Stomach cancer Neg Hx    Rectal cancer Neg Hx     Past Surgical History:  Procedure Laterality Date   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  Looking to get employment with childcare.   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

## 2024-02-21 ENCOUNTER — Ambulatory Visit (INDEPENDENT_AMBULATORY_CARE_PROVIDER_SITE_OTHER): Admitting: Orthopaedic Surgery

## 2024-02-21 ENCOUNTER — Telehealth: Payer: Self-pay | Admitting: Orthopaedic Surgery

## 2024-02-21 ENCOUNTER — Ambulatory Visit: Admitting: Orthopaedic Surgery

## 2024-02-21 ENCOUNTER — Other Ambulatory Visit (INDEPENDENT_AMBULATORY_CARE_PROVIDER_SITE_OTHER)

## 2024-02-21 VITALS — Ht 66.0 in | Wt 114.0 lb

## 2024-02-21 DIAGNOSIS — M1611 Unilateral primary osteoarthritis, right hip: Secondary | ICD-10-CM | POA: Diagnosis not present

## 2024-02-21 NOTE — Telephone Encounter (Signed)
 Left voicemail message for patient asking she return the call to discuss surgery date for right total hip with Dr. Christiane Cowing.  Provided my name and direct number to schedule.

## 2024-02-21 NOTE — Progress Notes (Signed)
 Office Visit Note   Patient: Shelley [name]           Date of Birth: 04-12-1956           MRN: [id number] Visit Date: 02/21/2024              Requested by: Lawrance Presume, MD [address] [place],  [place] [address] PCP: Lawrance Presume, MD   Assessment & Plan: Visit Diagnoses:  1. Primary osteoarthritis of right hip     Plan: History of Present Illness Shelley [name] is a 68 year old female with severe right hip osteoarthritis who presents for evaluation of worsening hip pain and consideration for hip replacement surgery.  She experiences worsening hip pain that severely limits her mobility and daily activities. She steadies herself before walking and nearly loses balance due to a 'crook' in her hip. Pain occurs upon flexion and rotation of the hip.  She received two hip injections in the past year, which provided temporary relief.   Physical Exam MUSCULOSKELETAL: Severe pain on right hip flexion and rotation.  Results RADIOLOGY Right hip X-ray: Bone-on-bone contact, numerous subchondral cysts in the femoral head and acetabulum.  Assessment and Plan Severe osteoarthritis of right hip Chronic, worsening osteoarthritis with severe degeneration and bone-on-bone contact. Hip replacement surgery recommended due to significant pain and functional limitations. - Schedule hip replacement surgery ASAP per patient request - Will plan for outpatient status - Will arrange home health PT.  Follow-Up Instructions: No follow-ups on file.   Orders:  Orders Placed This Encounter  Procedures   XR HIP UNILAT W OR W/O PELVIS 2-3 VIEWS RIGHT   No orders of the defined types were placed in this encounter.   Subjective: Chief Complaint  Patient presents with   Right Hip - Pain     Review of Systems  Constitutional: Negative.   HENT: Negative.    Eyes: Negative.   Respiratory: Negative.    Cardiovascular: Negative.   Endocrine: Negative.   Musculoskeletal:  Negative.   Neurological: Negative.   Hematological: Negative.   Psychiatric/Behavioral: Negative.    All other systems reviewed and are negative.    Objective: Vital Signs: Ht 5\' 6"  (1.676 m)   Wt 114 lb (51.7 kg)   LMP  (LMP Unknown)   BMI 18.40 kg/m   Physical Exam Vitals and nursing note reviewed.  Constitutional:      Appearance: She is well-developed.  HENT:     Head: Atraumatic.     Nose: Nose normal.  Eyes:     Extraocular Movements: Extraocular movements intact.  Cardiovascular:     Pulses: Normal pulses.  Pulmonary:     Effort: Pulmonary effort is normal.  Abdominal:     Palpations: Abdomen is soft.  Musculoskeletal:     Cervical back: Neck supple.  Skin:    General: Skin is warm.     Capillary Refill: Capillary refill takes less than 2 seconds.  Neurological:     Mental Status: She is alert. Mental status is at baseline.  Psychiatric:        Behavior: Behavior normal.        Thought Content: Thought content normal.        Judgment: Judgment normal.    Imaging: XR HIP UNILAT W OR W/O PELVIS 2-3 VIEWS RIGHT Result Date: 02/21/2024 X-rays of the right hip and pelvis show severe right hip osteoarthritis with bone-on-bone joint space narrowing.  Widespread subchondral cystic formation.  PMFS History: Patient Active Problem List   Diagnosis Date Noted   Primary osteoarthritis of right hip 03/02/2022   Primary osteoarthritis of left hip 03/02/2022   Primary osteoarthritis of both hips 02/19/2022   Essential hypertension 09/11/2021   Light smoker 09/11/2021   Hyperlipidemia 09/11/2021   Hip pain, bilateral 09/11/2021   Chronic pain of both knees 09/11/2021   Dyspnea 03/25/2021   CAP (community acquired pneumonia) 07/25/2018   Tobacco use 07/25/2018   Elevated bilirubin 07/25/2018   Alcohol use 07/25/2018   Environmental and seasonal allergies 10/16/2015   Past Medical History:  Diagnosis Date   Allergy    Environmental and Seasonal:   Pharyngeal drainage, cough and eye symptoms   Elevated blood pressure reading without diagnosis of hypertension 2016   Hyperlipidemia    Hypertension    Tobacco use     Family History  Problem Relation Age of Onset   Heart disease Mother    Hypertension Mother    Congestive Heart Failure Mother        Cause of death   Congestive Heart Failure Father    Stroke Father        Cause of death   Hypertension Father    Alcohol abuse Brother    Hypertension Brother    Heart disease Maternal Grandmother    Stroke Maternal Grandmother    Diabetes Sister    Hypertension Sister    Heart disease Sister        Arrhythmia and myocarditis/Congenital Heart Disease/possible arrhythmia   Diabetes Paternal Grandmother    Diabetes Paternal Grandfather    Colitis Neg Hx    Esophageal cancer Neg Hx    Stomach cancer Neg Hx    Rectal cancer Neg Hx     Past Surgical History:  Procedure Laterality Date   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  Looking to get employment with childcare.   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

## 2024-02-21 NOTE — Telephone Encounter (Signed)
 Patient called. She would like her medication called in to the pharmacy. She does not know what exactly will be called in. Pain medication.

## 2024-02-21 NOTE — Telephone Encounter (Signed)
 Let's try tramadol .  Thanks.

## 2024-02-21 NOTE — Telephone Encounter (Signed)
 Xu, what would you like me to send in?

## 2024-02-22 ENCOUNTER — Ambulatory Visit: Admitting: Sports Medicine

## 2024-02-22 ENCOUNTER — Other Ambulatory Visit: Payer: Self-pay | Admitting: Physician Assistant

## 2024-02-22 MED ORDER — TRAMADOL HCL 50 MG PO TABS: 50.0000 mg | ORAL_TABLET | Freq: Two times a day (BID) | ORAL | 0 refills | Status: DC | PRN

## 2024-02-22 NOTE — Telephone Encounter (Signed)
 Called and notified patient.

## 2024-02-22 NOTE — Telephone Encounter (Signed)
 Sent in tramadol

## 2024-04-04 ENCOUNTER — Telehealth: Payer: Self-pay | Admitting: Orthopaedic Surgery

## 2024-04-04 NOTE — Telephone Encounter (Signed)
 Ptneed a call bck concerning a home health aide to help her after surgery. Pt states she live alone and this was discussed with Dr Jerri. Please call pt about this matter ASAP at [phone].

## 2024-04-04 NOTE — Telephone Encounter (Signed)
 Called patient. Explained that HHPT would be ordered and they come in usually twice weekly to teach and work with strengthening, exercises, and ambulation. After two weeks patient is typically discharged and sent to outpatient PT. She is requesting someone to come in DAILY to help and assist her through the house, and make sure she is okay. I explained that typically insurance does not cover this and would be out of pocket. She feels like she was misled in her discussion with Dr.Xu. I explained that she will be up and walking after hip replacement. The therapist will make sure she is able to get around safely before releasing her from the hospital. She got very upset and got off the phone.

## 2024-04-19 ENCOUNTER — Other Ambulatory Visit: Payer: Self-pay | Admitting: Physician Assistant

## 2024-04-19 MED ORDER — OXYCODONE-ACETAMINOPHEN 5-325 MG PO TABS: 1.0000 | ORAL_TABLET | Freq: Four times a day (QID) | ORAL | 0 refills | Status: DC | PRN

## 2024-04-19 MED ORDER — ONDANSETRON HCL 4 MG PO TABS: 4.0000 mg | ORAL_TABLET | Freq: Three times a day (TID) | ORAL | 0 refills | Status: AC | PRN

## 2024-04-19 MED ORDER — DOCUSATE SODIUM 100 MG PO CAPS: 100.0000 mg | ORAL_CAPSULE | Freq: Every day | ORAL | 2 refills | Status: AC | PRN

## 2024-04-19 MED ORDER — METHOCARBAMOL 750 MG PO TABS: 750.0000 mg | ORAL_TABLET | Freq: Three times a day (TID) | ORAL | 2 refills | Status: AC | PRN

## 2024-04-20 ENCOUNTER — Telehealth: Payer: Self-pay | Admitting: Internal Medicine

## 2024-04-20 ENCOUNTER — Inpatient Hospital Stay (HOSPITAL_COMMUNITY): Admission: RE | Admit: 2024-04-20 | Source: Ambulatory Visit

## 2024-04-20 NOTE — Telephone Encounter (Signed)
 Called patient to confirm upcoming appointment 04/24/2024 at 10:30 am with Dr.Supple.  Patient appointment has been successfully confirmed.

## 2024-04-20 NOTE — Progress Notes (Signed)
 Surgical Instructions   Your procedure is scheduled on Monday April 30, 2024. Report to [facility] Main Entrance A at 6:00 A.M., then check in with the Admitting office. Any questions or running late day of surgery: call [phone]  Questions prior to your surgery date: call [phone], Monday-Friday, 8am-4pm. If you experience any cold or flu symptoms such as cough, fever, chills, shortness of breath, etc. between now and your scheduled surgery, please notify us  at the above number.     Remember:  Do not eat after midnight the night before your surgery  You may drink clear liquids until 5:30 the morning of your surgery.   Clear liquids allowed are: Water, Non-Citrus Juices (without pulp), Carbonated Beverages, Clear Tea (no milk, honey, etc.), Black Coffee Only (NO MILK, CREAM OR POWDERED CREAMER of any kind), and Gatorade.  Patient Instructions  The night before surgery:  No food after midnight. ONLY clear liquids after midnight  The day of surgery (if you do NOT have diabetes):  Drink ONE (1) Pre-Surgery Clear Ensure by 5:30 the morning of surgery. Drink in one sitting. Do not sip.  This drink was given to you during your hospital  pre-op appointment visit.  Nothing else to drink after completing the  Pre-Surgery Clear Ensure.         If you have questions, please contact your surgeon's office.    Take these medicines the morning of surgery with A SIP OF WATER  amLODipine  (NORVASC )  pravastatin  (PRAVACHOL )    May take these medicines IF NEEDED: methocarbamol  (ROBAXIN )  ondansetron  (ZOFRAN )  traMADol  (ULTRAM )   One week prior to surgery, STOP taking any Aspirin (unless otherwise instructed by your surgeon) Aleve, Naproxen, Ibuprofen, Motrin, Advil, Goody's, BC's, all herbal medications, fish oil, and non-prescription vitamins.                     Do NOT Smoke (Tobacco/Vaping) for 24 hours prior to your procedure.  If you use a CPAP at night, you may bring your  mask/headgear for your overnight stay.   You will be asked to remove any contacts, glasses, piercing's, hearing aid's, dentures/partials prior to surgery. Please bring cases for these items if needed.    Patients discharged the day of surgery will not be allowed to drive home, and someone needs to stay with them for 24 hours.  SURGICAL WAITING ROOM VISITATION Patients may have no more than 2 support people in the waiting area - these visitors may rotate.   Pre-op nurse will coordinate an appropriate time for 1 ADULT support person, who may not rotate, to accompany patient in pre-op.  Children under the age of 52 must have an adult with them who is not the patient and must remain in the main waiting area with an adult.  If the patient needs to stay at the hospital during part of their recovery, the visitor guidelines for inpatient rooms apply.  Please refer to the [facility] website for the visitor guidelines for any additional information.   If you received a COVID test during your pre-op visit  it is requested that you wear a mask when out in public, stay away from anyone that may not be feeling well and notify your surgeon if you develop symptoms. If you have been in contact with anyone that has tested positive in the last 10 days please notify you surgeon.      Pre-operative 5 CHG Bathing Instructions   You can play a key  role in reducing the risk of infection after surgery. Your skin needs to be as free of germs as possible. You can reduce the number of germs on your skin by washing with CHG (chlorhexidine gluconate) soap before surgery. CHG is an antiseptic soap that kills germs and continues to kill germs even after washing.   DO NOT use if you have an allergy to chlorhexidine/CHG or antibacterial soaps. If your skin becomes reddened or irritated, stop using the CHG and notify one of our RNs at [phone].   Please shower with the CHG soap starting 4 days before surgery using the  following schedule:     Please keep in mind the following:  DO NOT shave, including legs and underarms, starting the day of your first shower.   Place clean sheets on your bed the day you start using CHG soap. Use a clean washcloth (not used since being washed) for each shower. DO NOT sleep with pets once you start using the CHG.   CHG Shower Instructions:  Wash your face and private area with normal soap. If you choose to wash your hair, wash first with your normal shampoo.  After you use shampoo/soap, rinse your hair and body thoroughly to remove shampoo/soap residue.  Turn the water OFF and apply about 3 tablespoons (45 ml) of CHG soap to a CLEAN washcloth.  Apply CHG soap ONLY FROM YOUR NECK DOWN TO YOUR TOES (washing for 3-5 minutes)  DO NOT use CHG soap on face, private areas, open wounds, or sores.  Pay special attention to the area where your surgery is being performed.  If you are having back surgery, having someone wash your back for you may be helpful. Wait 2 minutes after CHG soap is applied, then you may rinse off the CHG soap.  Pat dry with a clean towel  Put on clean clothes/pajamas   If you choose to wear lotion, please use ONLY the CHG-compatible lotions that are listed below.  Additional instructions for the day of surgery: DO NOT APPLY any lotions, deodorants or perfumes.   Do not bring valuables to the hospital. [facility] is not responsible for any belongings/valuables. Do not wear nail polish, gel polish, artificial nails, or any other type of covering on natural nails (fingers and toes) Do not wear jewelry or makeup Put on clean/comfortable clothes.  Please brush your teeth.  Ask your nurse before applying any prescription medications to the skin.     CHG Compatible Lotions   Aveeno Moisturizing lotion  Cetaphil Moisturizing Cream  Cetaphil Moisturizing Lotion  Clairol Herbal Essence Moisturizing Lotion, Dry Skin  Clairol Herbal Essence Moisturizing  Lotion, Extra Dry Skin  Clairol Herbal Essence Moisturizing Lotion, Normal Skin  Curel Age Defying Therapeutic Moisturizing Lotion with Alpha Hydroxy  Curel Extreme Care Body Lotion  Curel Soothing Hands Moisturizing Hand Lotion  Curel Therapeutic Moisturizing Cream, Fragrance-Free  Curel Therapeutic Moisturizing Lotion, Fragrance-Free  Curel Therapeutic Moisturizing Lotion, Original Formula  Eucerin Daily Replenishing Lotion  Eucerin Dry Skin Therapy Plus Alpha Hydroxy Crme  Eucerin Dry Skin Therapy Plus Alpha Hydroxy Lotion  Eucerin Original Crme  Eucerin Original Lotion  Eucerin Plus Crme Eucerin Plus Lotion  Eucerin TriLipid Replenishing Lotion  Keri Anti-Bacterial Hand Lotion  Keri Deep Conditioning Original Lotion Dry Skin Formula Softly Scented  Keri Deep Conditioning Original Lotion, Fragrance Free Sensitive Skin Formula  Keri Lotion Fast Absorbing Fragrance Free Sensitive Skin Formula  Keri Lotion Fast Absorbing Softly Scented Dry Skin Formula  Keri Original  Lotion  [employer] Skin Renewal Lotion Keri Silky Smooth Lotion  Keri Silky Smooth Sensitive Skin Lotion  Nivea Body Creamy Conditioning Oil  Nivea Body Extra Enriched Teacher, adult education Moisturizing Lotion Nivea Crme  Nivea Skin Firming Lotion  NutraDerm 30 Skin Lotion  NutraDerm Skin Lotion  NutraDerm Therapeutic Skin Cream  NutraDerm Therapeutic Skin Lotion  ProShield Protective Hand Cream  Provon moisturizing lotion  Please read over the following fact sheets that you were given.

## 2024-04-23 ENCOUNTER — Encounter (HOSPITAL_COMMUNITY): Payer: Self-pay

## 2024-04-23 ENCOUNTER — Encounter (HOSPITAL_COMMUNITY)
Admission: RE | Admit: 2024-04-23 | Discharge: 2024-04-23 | Disposition: A | Discharge status: Home / Self Care | Source: Ambulatory Visit | Attending: Orthopaedic Surgery | Admitting: Orthopaedic Surgery

## 2024-04-23 ENCOUNTER — Other Ambulatory Visit: Payer: Self-pay

## 2024-04-23 VITALS — BP 150/61 | HR 73 | Temp 97.9°F | Resp 16 | Ht 67.0 in | Wt 113.6 lb

## 2024-04-23 DIAGNOSIS — M1611 Unilateral primary osteoarthritis, right hip: Secondary | ICD-10-CM | POA: Diagnosis not present

## 2024-04-23 DIAGNOSIS — Z01818 Encounter for other preprocedural examination: Secondary | ICD-10-CM | POA: Insufficient documentation

## 2024-04-23 HISTORY — DX: Unspecified osteoarthritis, unspecified site: M19.90

## 2024-04-23 HISTORY — DX: Anemia, unspecified: D64.9

## 2024-04-23 LAB — CBC
HCT: 42.4 % (ref 36.0–46.0)
Hemoglobin: 14.3 g/dL (ref 12.0–15.0)
MCH: 32.1 pg (ref 26.0–34.0)
MCHC: 33.7 g/dL (ref 30.0–36.0)
MCV: 95.1 fL (ref 80.0–100.0)
Platelets: 258 K/uL (ref 150–400)
RBC: 4.46 MIL/uL (ref 3.87–5.11)
RDW: 13.1 % (ref 11.5–15.5)
WBC: 4.8 K/uL (ref 4.0–10.5)
nRBC: 0 % (ref 0.0–0.2)

## 2024-04-23 LAB — SURGICAL PCR SCREEN
MRSA, PCR: NEGATIVE
Staphylococcus aureus: NEGATIVE

## 2024-04-23 LAB — BASIC METABOLIC PANEL WITH GFR
Anion gap: 7 (ref 5–15)
BUN: 12 mg/dL (ref 8–23)
CO2: 27 mmol/L (ref 22–32)
Calcium: 10 mg/dL (ref 8.9–10.3)
Chloride: 104 mmol/L (ref 98–111)
Creatinine, Ser: 0.5 mg/dL (ref 0.44–1.00)
GFR, Estimated: >60 mL/min (ref >60)
Glucose, Bld: 110 mg/dL — ABNORMAL HIGH (ref 70–99)
Potassium: 4.2 mmol/L (ref 3.5–5.1)
Sodium: 138 mmol/L (ref 135–145)

## 2024-04-23 LAB — TYPE AND SCREEN
ABO/RH(D): B POS
Antibody Screen: NEGATIVE

## 2024-04-23 NOTE — Progress Notes (Signed)
 PCP - Barnie Louder Cardiologist - denies  PPM/ICD - denies   Chest x-ray - denies EKG - 04/23/24 Stress Test - denies ECHO - denies Cardiac Cath - denies  Sleep Study - denies  No DM  Last dose of GLP1 agonist-  n/a GLP1 instructions:  n/a  Blood Thinner Instructions: n/a Aspirin Instructions: n/a  ERAS Protcol - clears until 0530 PRE-SURGERY Ensure or G2- Ensure as ordered  COVID TEST- n/a   Anesthesia review: no  Patient denies shortness of breath, fever, cough and chest pain at PAT appointment   All instructions explained to the patient, with a verbal understanding of the material. Patient agrees to go over the instructions while at home for a better understanding. Patient also instructed to self quarantine after being tested for COVID-19. The opportunity to ask questions was provided.

## 2024-04-24 ENCOUNTER — Encounter: Payer: Self-pay | Admitting: Internal Medicine

## 2024-04-24 ENCOUNTER — Ambulatory Visit: Attending: Internal Medicine | Admitting: Internal Medicine

## 2024-04-24 VITALS — BP 123/65 | HR 72 | Ht 67.0 in | Wt 116.0 lb

## 2024-04-24 DIAGNOSIS — R0681 Apnea, not elsewhere classified: Secondary | ICD-10-CM

## 2024-04-24 DIAGNOSIS — R194 Change in bowel habit: Secondary | ICD-10-CM

## 2024-04-24 DIAGNOSIS — R739 Hyperglycemia, unspecified: Secondary | ICD-10-CM

## 2024-04-24 DIAGNOSIS — I10 Essential (primary) hypertension: Secondary | ICD-10-CM

## 2024-04-24 DIAGNOSIS — R0683 Snoring: Secondary | ICD-10-CM | POA: Diagnosis not present

## 2024-04-24 DIAGNOSIS — F17210 Nicotine dependence, cigarettes, uncomplicated: Secondary | ICD-10-CM

## 2024-04-24 DIAGNOSIS — F17200 Nicotine dependence, unspecified, uncomplicated: Secondary | ICD-10-CM

## 2024-04-24 DIAGNOSIS — Z87898 Personal history of other specified conditions: Secondary | ICD-10-CM

## 2024-04-24 DIAGNOSIS — Z1211 Encounter for screening for malignant neoplasm of colon: Secondary | ICD-10-CM

## 2024-04-24 DIAGNOSIS — F4321 Adjustment disorder with depressed mood: Secondary | ICD-10-CM

## 2024-04-24 DIAGNOSIS — R159 Full incontinence of feces: Secondary | ICD-10-CM

## 2024-04-24 DIAGNOSIS — E785 Hyperlipidemia, unspecified: Secondary | ICD-10-CM

## 2024-04-24 DIAGNOSIS — M1611 Unilateral primary osteoarthritis, right hip: Secondary | ICD-10-CM | POA: Diagnosis not present

## 2024-04-24 LAB — POCT GLYCOSYLATED HEMOGLOBIN (HGB A1C): HbA1c POC (<> result, manual entry): 5.6 %

## 2024-04-24 LAB — GLUCOSE, POCT (MANUAL RESULT ENTRY): POC Glucose: 114 mg/dL — AB (ref 70–99)

## 2024-04-24 MED ORDER — AMLODIPINE BESYLATE 10 MG PO TABS: ORAL_TABLET | ORAL | 1 refills | Status: DC

## 2024-04-24 MED ORDER — VALSARTAN 40 MG PO TABS: 40.0000 mg | ORAL_TABLET | Freq: Every day | ORAL | 2 refills | Status: AC

## 2024-04-24 NOTE — Patient Instructions (Signed)
 VISIT SUMMARY:  Today, you had a follow-up appointment to discuss your chronic medical conditions, including hypertension, hyperlipidemia, and other health concerns. We reviewed your current medications, lifestyle changes, and upcoming right hip replacement surgery.  YOUR PLAN:  -RIGHT HIP OSTEOARTHRITIS: Osteoarthritis is a condition where the cartilage in your joints wears down over time, causing pain and stiffness. You are scheduled for right total hip replacement surgery, and all preoperative assessments are normal. There are no expected complications with anesthesia, so you can proceed with the surgery as planned.  -HYPERTENSION: Hypertension, or high blood pressure, is when the force of your blood against your artery walls is too high. Your blood pressure is currently well-controlled at 123/65 mmHg. We will refill your amlodipine  and valsartan  for 90 days. Please monitor your blood pressure weekly and continue to reduce your salt intake. You may also consider taking your medications in the morning instead of the evening.  -HYPERLIPIDEMIA: Hyperlipidemia is when you have high levels of fats (lipids) in your blood, which can increase your risk of heart disease. You have been taking pravastatin  once a week but should increase this to three times a week to better manage your cholesterol levels.  -TOBACCO USE DISORDER: Tobacco use disorder is a dependence on tobacco products. You have significantly reduced your cigarette use, which is excellent. To help you quit completely, consider using nicotine  patches or gum, especially before your surgery.  -ALCOHOL USE DISORDER: Alcohol use disorder is a pattern of alcohol use that involves problems controlling your drinking. You have reduced your alcohol intake, which is a positive step. Continue to reduce your alcohol consumption.  -FECAL INCONTINENCE: Fecal incontinence is the inability to control bowel movements, causing stool to leak unexpectedly. You  have been experiencing this, especially after eating beef. We will refer you to a gastroenterologist for further evaluation and to complete your colon cancer screening.  -OBSTRUCTIVE SLEEP APNEA (SUSPECTED): Obstructive sleep apnea is a condition where your breathing repeatedly stops and starts during sleep. You have not completed your home sleep study due to electrical concerns but plan to do so after your hip surgery. We will reorder the home sleep study once you have recovered from surgery.  -DEPRESSION (SITUATIONAL): Situational depression is a short-term form of depression that occurs in response to a specific situation. Your depression is related to your hip pain and limited mobility. We will monitor your symptoms, and you may see improvement after your surgery.  -GENERAL HEALTH MAINTENANCE: Your A1c levels are normal, indicating you do not have diabetes. It is important not to skip meals, especially since you are underweight. Continue to monitor your blood sugar and A1c levels as needed.  INSTRUCTIONS:  Please proceed with your right total hip replacement surgery as scheduled. Refill your amlodipine  and valsartan  for 90 days and monitor your blood pressure weekly. Increase your pravastatin  intake to three times a week. Consider using nicotine  patches or gum to help quit smoking before surgery. Continue to reduce your alcohol consumption. Follow up with a gastroenterologist for your fecal incontinence and colon cancer screening. We will reorder your home sleep study after you recover from hip surgery. Monitor your depression symptoms post-surgery and do not skip meals.

## 2024-04-24 NOTE — Progress Notes (Signed)
 Patient ID: Shelley [name], female    DOB: 26-Dec-1955  MRN: [id number]  CC: Hypertension (HTN f/u. /Reduced appetite due to fear of uncontrolled BS, fatigue/)   Subjective: Shelley [name] is a 68 y.o. female who presents for chronic ds management. Her concerns today include:  Patient with history of HTN, HL, tob dep, EtOH use, allergies, OA hips   Discussed the use of AI scribe software for clinical note transcription with the patient, who gave verbal consent to proceed.  History of Present Illness Shelley [name] is a 68 year old female who presents for follow-up of her chronic medical conditions.  HTN: She is managing hypertension with amlodipine  10 mg daily and valsartan  40 mg daily, both taken in the evening. She has about 20 days of amlodipine  left and only 3 pills of valsartan  remaining. She does not regularly monitor her blood pressure at home despite having a device. She is limiting salt intake but has not reduced pork consumption. No chest pain or shortness of breath.  HL: she switched from rosuvastatin  to pravastatin  10 mg to take 3x/wk due to leg cramps. She currently takes pravastatin  once a week but plans to increase to three times a week as prescribed.   Tob: She has significantly reduced tobacco use, smoking only two cigarettes this month compared to two per day on previous visit, and is attempting to quit completely without nicotine  replacement therapy.   ETOH Use: Her alcohol consumption has decreased from one to two mixed drinks daily to three shots in the past week.  She was previously referred for a sleep study due to loud snoring and reported apneas by her daughter but did not complete the home study due to concerns about her home's electrical system. She wants to complete the study after her upcoming right hip replacement surgery scheduled for Monday.  She reports that everything she eats runs right through her system.  She has 3 bowel movements a day of soft stools.   She is also noted fecal smearing for the past month that is worse after she eats beef.  No nausea, vomiting, or abdominal pain currently, though she had a bout of colitis two months ago. She wears incontinence pads due to smearing.  Looks like she is due for repeat colonoscopy.  Last one done 3 years ago by Dr. Charlanne where she had some polyps removed.  She has experienced a decrease in appetite and weight loss due to fear of eating after being advised on blood test done last visit that her blood sugar was elevated.  She was supposed to return to the lab to have an A1c done.  She is underweight for height.  She is scheduled for right hip replacement surgery on Monday and has experienced limited mobility, requiring the use of a cane. She previously could walk several blocks and climb stairs without difficulty.  Reports no issues with anesthesia in the past.   Depression screen today is positive.  She feels depressed due to her limited mobility but is hopeful that her mood will improve post-surgery.  HM: Due for Medicare wellness visit, mammogram, colon cancer screening and DEXA    Patient Active Problem List   Diagnosis Date Noted   Primary osteoarthritis of right hip 03/02/2022   Primary osteoarthritis of left hip 03/02/2022   Primary osteoarthritis of both hips 02/19/2022   Essential hypertension 09/11/2021   Light smoker 09/11/2021   Hyperlipidemia 09/11/2021   Hip pain, bilateral 09/11/2021   Chronic  pain of both knees 09/11/2021   Dyspnea 03/25/2021   CAP (community acquired pneumonia) 07/25/2018   Tobacco use 07/25/2018   Elevated bilirubin 07/25/2018   Alcohol use 07/25/2018   Environmental and seasonal allergies 10/16/2015     Current Outpatient Medications on File Prior to Visit  Medication Sig Dispense Refill   Boswellia-Glucosamine-Vit D (OSTEO BI-FLEX ONE PER DAY) TABS Take 1 tablet by mouth 3 (three) times a week.     Cholecalciferol (VITAMIN D3) 25 MCG (1000 UT) CHEW Chew  1,000 Units by mouth 3 (three) times a week.     docusate sodium  (COLACE) 100 MG capsule Take 1 capsule (100 mg total) by mouth daily as needed. 30 capsule 2   methocarbamol  (ROBAXIN ) 750 MG tablet Take 1 tablet (750 mg total) by mouth 3 (three) times daily as needed. 30 tablet 2   Multiple Vitamin (MULTIVITAMIN WITH MINERALS) TABS tablet Take 1 tablet by mouth daily.     ondansetron  (ZOFRAN ) 4 MG tablet Take 1 tablet (4 mg total) by mouth every 8 (eight) hours as needed for nausea or vomiting. 40 tablet 0   oxyCODONE -acetaminophen  (PERCOCET) 5-325 MG tablet Take 1-2 tablets by mouth every 6 (six) hours as needed. To be taken after surgery 40 tablet 0   pravastatin  (PRAVACHOL ) 10 MG tablet Take 1 tab PO Q Mon/Wed/Fri 36 tablet 2   celecoxib  (CELEBREX ) 100 MG capsule TAKE 1 CAPSULE(100 MG) BY MOUTH DAILY (Patient not taking: Reported on 04/24/2024) 30 capsule 0   nicotine  polacrilex (NICORETTE ) 2 MG gum Take 1 each (2 mg total) by mouth as needed for smoking cessation. (Patient not taking: Reported on 04/24/2024) 100 tablet 3   traMADol  (ULTRAM ) 50 MG tablet Take 1 tablet (50 mg total) by mouth 2 (two) times daily as needed. (Patient not taking: Reported on 04/24/2024) 30 tablet 0   Vitamin D , Ergocalciferol , (DRISDOL ) 1.25 MG (50000 UNIT) CAPS capsule Take 1 capsule (50,000 Units total) by mouth every 7 (seven) days. (Patient not taking: Reported on 04/24/2024) 12 capsule 1   No current facility-administered medications on file prior to visit.    Allergies  Allergen Reactions   Meloxicam     Stomach pain   Metronidazole  Nausea And Vomiting   Cephalexin Diarrhea    Developed diarrhea after only 2 doses.   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  Looking to get employment with childcare.   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  Went to school for speech pathology and communication--spent time at [employer] and [employer]--2 years of college   Lives with 2 daughters, ages 13 and 8 yo.   Social Drivers of Corporate investment banker Strain: Low Risk  (11/18/2022)   Overall Financial Resource Strain (CARDIA)    Difficulty of Paying Living Expenses: Not hard at all  Food Insecurity: No Food Insecurity (11/18/2022)   Hunger Vital Sign    Worried About Running Out of Food in the Last Year: Never true    Ran Out of Food in the Last Year: Never true  Transportation Needs: No Transportation Needs (11/18/2022)   PRAPARE - Administrator, Civil Service (Medical): No    Lack of Transportation (Non-Medical): No  Physical Activity: Inactive (11/18/2022)   Exercise Vital Sign    Days of Exercise per Week: 0 days    Minutes of Exercise per Session: 0 min  Stress: No Stress Concern Present (11/18/2022)   [employer] of Occupational Health - Occupational Stress Questionnaire    Feeling of Stress : Not at all  Social Connections: Not on file  Intimate Partner Violence: Not on file    Family History  Problem Relation Age of Onset   Heart disease Mother    Hypertension Mother    Congestive Heart Failure Mother        Cause of death    Congestive Heart Failure Father    Stroke Father        Cause of death   Hypertension Father    Alcohol abuse Brother    Hypertension Brother    Heart disease Maternal Grandmother    Stroke Maternal Grandmother    Diabetes Sister    Hypertension Sister    Heart disease Sister        Arrhythmia and myocarditis/Congenital Heart Disease/possible arrhythmia   Diabetes Paternal Grandmother    Diabetes Paternal Grandfather    Colitis Neg Hx    Esophageal cancer Neg Hx    Stomach cancer Neg Hx    Rectal cancer Neg Hx     Past Surgical History:  Procedure Laterality Date   COLONOSCOPY     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 Mental status - normal mood, behavior, speech, dress, motor activity, and thought processes Eyes - pupils equal and reactive, extraocular eye movements intact Mouth - mucous membranes moist, pharynx normal without lesions Neck - supple, no significant adenopathy Chest - clear to auscultation, no wheezes, rales or rhonchi, symmetric air entry Heart - normal rate, regular rhythm, normal S1, S2, no murmurs, rubs, clicks or gallops Musculoskeletal -patient walks favoring her right hip.  She is able to get on the exam table independently. Extremities - peripheral pulses normal, no pedal edema, no clubbing or cyanosis  I have reviewed the EKG that she had done yesterday as part of her preoperative studies     04/24/2024   11:53  AM 04/24/2024   10:49 AM 08/04/2023    1:41 PM  Depression screen PHQ 2/9  Decreased Interest 2 2 0  Down,  Depressed, Hopeless 2 2 0  PHQ - 2 Score 4 4 0  Altered sleeping 3 3 2   Tired, decreased energy 2 2 2   Change in appetite 3 3 1   Feeling bad or failure about yourself  1 1 0  Trouble concentrating 0 0 1  Moving slowly or fidgety/restless 3 3 0  Suicidal thoughts 0 0 0  PHQ-9 Score 16 16 6   Difficult doing work/chores Not difficult at all Somewhat difficult Not difficult at all       Latest Ref Rng & Units 04/23/2024   10:00 AM 08/04/2023    2:20 PM 11/26/2022    3:20 PM  CMP  Glucose 70 - 99 mg/dL 889  810  86   BUN 8 - 23 mg/dL 12  15  15    Creatinine 0.44 - 1.00 mg/dL 9.49  9.25  9.42   Sodium 135 - 145 mmol/L 138  146  141   Potassium 3.5 - 5.1 mmol/L 4.2  5.2  4.3   Chloride 98 - 111 mmol/L 104  105  101   CO2 22 - 32 mmol/L 27  25  25    Calcium  8.9 - 10.3 mg/dL 89.9  89.1  89.8   Total Protein 6.0 - 8.5 g/dL  7.9  7.5   Total Bilirubin 0.0 - 1.2 mg/dL  0.5  0.6   Alkaline Phos 44 - 121 IU/L  97  98   AST 0 - 40 IU/L  17  13   ALT 0 - 32 IU/L  16  16    Lipid Panel     Component Value Date/Time   CHOL 280 (H) 08/04/2023 1420   TRIG 166 (H) 08/04/2023 1420   HDL 72 08/04/2023 1420   CHOLHDL 3.9 08/04/2023 1420   LDLCALC 178 (H) 08/04/2023 1420    CBC    Component Value Date/Time   WBC 4.8 04/23/2024 1000   RBC 4.46 04/23/2024 1000   HGB 14.3 04/23/2024 1000   HGB 15.3 08/04/2023 1420   HCT 42.4 04/23/2024 1000   HCT 45.0 08/04/2023 1420   PLT 258 04/23/2024 1000   PLT 240 08/04/2023 1420   MCV 95.1 04/23/2024 1000   MCV 95 08/04/2023 1420   MCH 32.1 04/23/2024 1000   MCHC 33.7 04/23/2024 1000   RDW 13.1 04/23/2024 1000   RDW 13.0 08/04/2023 1420   LYMPHSABS 2.2 09/11/2021 1017   MONOABS 1.2 (H) 10/29/2018 1725   EOSABS 0.2 09/11/2021 1017   BASOSABS 0.1 09/11/2021 1017   Results for orders placed or performed in visit on 04/24/24  POCT glycosylated hemoglobin (Hb A1C)   Collection Time: 04/24/24 11:54 AM  Result Value Ref Range   Hemoglobin A1C      HbA1c POC (<> result, manual entry) 5.6 4.0 - 5.6 %   HbA1c, POC (prediabetic range)     HbA1c, POC (controlled diabetic range)    POCT glucose (manual entry)   Collection Time: 04/24/24 11:54 AM  Result Value Ref Range   POC Glucose 114 (A) 70 - 99 mg/dl    ASSESSMENT AND PLAN: 1. Primary osteoarthritis of right hip (Primary) Chronic medical conditions are stable.  Okay to proceed with surgery at this time.  Advised patient to quit the cigarettes completely.  Advised against increased use of alcohol over the next several days to prevent  withdrawals postsurgery. - Anesthesiologist needs to be aware that she is suspected of having sleep apnea so that precautions are taken perioperatively  2. Essential hypertension At goal.  Continue valsartan  and amlodipine . - valsartan  (DIOVAN ) 40 MG tablet; Take 1 tablet (40 mg total) by mouth daily.  Dispense: 90 tablet; Refill: 2 - amLODipine  (NORVASC ) 10 MG tablet; TAKE 1 TABLET(10 MG) BY MOUTH DAILY  Dispense: 90 tablet; Refill: 1  3. Hyperlipidemia, unspecified hyperlipidemia type Encouraged her to take the pravastatin  as prescribed 3 times a week.  4. Tobacco dependence Commended her on significantly cutting back.  She is almost there in terms of quitting.  Encouraged her to quit completely  5. Loud snoring 6. Witnessed episode of apnea Patient agreeable to pursue this after she has her hip replacement surgery and has recovered.  We will address on subsequent visit.  7. Hyperglycemia A1c today is normal.  Advised her that she does not have diabetes.  Encouraged her to eat her 3 meals a day - POCT glycosylated hemoglobin (Hb A1C) - POCT glucose (manual entry)  8. Change in bowel habits 9. Fecal soiling due to fecal incontinence Will get her back in with her gastroenterologist. - Ambulatory referral to Gastroenterology  10. History of alcohol use disorder Commended her on cutting back significantly.  Encouraged her to abstain as much  as possible  11. Screening for colon cancer - Ambulatory referral to Gastroenterology  12. Situational depression Patient declines any medication or counseling at this time.  She feels her depression is due to her limited mobility and will get better once she has had her hip replacement surgery.  Patient was given the opportunity to ask questions.  Patient verbalized understanding of the plan and was able to repeat key elements of the plan.   This documentation was completed using Paediatric nurse.  Any transcriptional errors are unintentional.  Orders Placed This Encounter  Procedures   Ambulatory referral to Gastroenterology   POCT glycosylated hemoglobin (Hb A1C)   POCT glucose (manual entry)     Requested Prescriptions   Signed Prescriptions Disp Refills   valsartan  (DIOVAN ) 40 MG tablet 90 tablet 2    Sig: Take 1 tablet (40 mg total) by mouth daily.   amLODipine  (NORVASC ) 10 MG tablet 90 tablet 1    Sig: TAKE 1 TABLET(10 MG) BY MOUTH DAILY    Return in about 4 months (around 08/25/2024).  Barnie Louder, MD, FACP

## 2024-04-26 ENCOUNTER — Telehealth: Payer: Self-pay

## 2024-04-26 NOTE — Telephone Encounter (Signed)
 Patient called and stated she is having surgery on Monday and she needs to get a raised toilet seat and a bedside commode. Stated she hasn't heard from anyone about these things and wanted to get them before Monday.  Please call and advise

## 2024-04-26 NOTE — Telephone Encounter (Signed)
 Ordered via [employer]. Notified patient. Also told her to look out for a call or text from an unknown number. They will be reaching out to her.

## 2024-04-27 MED ORDER — TRANEXAMIC ACID 1000 MG/10ML IV SOLN
2000.0000 mg | INTRAVENOUS | Status: DC
  Filled 2024-04-27: qty 20

## 2024-04-27 NOTE — Progress Notes (Signed)
 Patient made aware of new arrival time for surgery on 04/30/2024. Patient to arrive at 0530. Patient told to complete Ensure by 0430. Patient verbalized understanding.

## 2024-04-28 ENCOUNTER — Telehealth: Payer: Self-pay | Admitting: *Deleted

## 2024-04-28 NOTE — Care Plan (Signed)
 [facility] RNCM call to patient to discuss her upcoming Right total hip arthroplasty with Dr. Jerri on 04/30/24 at [facility]. She is agreeable to case management. She lives alone, but has a daughter that will be coming to stay with her after her surgery for around a week after discharge. She will need a RW. Anticipate Same day discharge per patient and Dr. Jerri. Anticipate HHPT will be needed. Referral made to [facility] after choice provided. Reviewed all post op care instructions. Will continue to follow for needs.

## 2024-04-28 NOTE — Telephone Encounter (Signed)
 [facility] RNCM pre-op  call completed.

## 2024-04-29 NOTE — Anesthesia Preprocedure Evaluation (Signed)
 Anesthesia Evaluation  Patient identified by MRN, date of birth, ID band Patient awake    Reviewed: Allergy & Precautions, H&P , NPO status , Patient's Chart, lab work & pertinent test results  Airway Mallampati: II  TM Distance: >3 FB Neck ROM: Full    Dental no notable dental hx. (+) Poor Dentition, Chipped, Missing, Dental Advisory Given   Pulmonary neg pulmonary ROS, shortness of breath, pneumonia, Current Smoker and Patient abstained from smoking.   Pulmonary exam normal breath sounds clear to auscultation       Cardiovascular Exercise Tolerance: Good hypertension, Pt. on medications negative cardio ROS Normal cardiovascular exam Rhythm:Regular Rate:Normal     Neuro/Psych negative neurological ROS  negative psych ROS   GI/Hepatic negative GI ROS, Neg liver ROS,,,  Endo/Other  negative endocrine ROS    Renal/GU negative Renal ROS  negative genitourinary   Musculoskeletal negative musculoskeletal ROS (+) Arthritis ,    Abdominal   Peds negative pediatric ROS (+)  Hematology negative hematology ROS (+) Blood dyscrasia, anemia   Anesthesia Other Findings   Reproductive/Obstetrics negative OB ROS                              Anesthesia Physical Anesthesia Plan  ASA: 3  Anesthesia Plan: MAC and Spinal   Post-op Pain Management: Minimal or no pain anticipated   Induction: Intravenous  PONV Risk Score and Plan: 1 and Propofol  infusion and Midazolam   Airway Management Planned: Natural Airway and Simple Face Mask  Additional Equipment: None  Intra-op Plan:   Post-operative Plan:   Informed Consent: I have reviewed the patients History and Physical, chart, labs and discussed the procedure including the risks, benefits and alternatives for the proposed anesthesia with the patient or authorized representative who has indicated his/her understanding and acceptance.       Plan  Discussed with: Anesthesiologist and CRNA  Anesthesia Plan Comments:          Anesthesia Quick Evaluation

## 2024-04-30 ENCOUNTER — Ambulatory Visit (HOSPITAL_COMMUNITY)

## 2024-04-30 ENCOUNTER — Ambulatory Visit (HOSPITAL_COMMUNITY): Payer: Self-pay | Admitting: Anesthesiology

## 2024-04-30 ENCOUNTER — Ambulatory Visit (HOSPITAL_COMMUNITY)
Admission: RE | Admit: 2024-04-30 | Discharge: 2024-04-30 | Disposition: A | Discharge status: Home / Self Care | Attending: Orthopaedic Surgery | Admitting: Orthopaedic Surgery

## 2024-04-30 ENCOUNTER — Other Ambulatory Visit: Payer: Self-pay

## 2024-04-30 ENCOUNTER — Observation Stay (HOSPITAL_COMMUNITY)

## 2024-04-30 ENCOUNTER — Other Ambulatory Visit (HOSPITAL_COMMUNITY): Payer: Self-pay

## 2024-04-30 ENCOUNTER — Other Ambulatory Visit: Payer: Self-pay | Admitting: Physician Assistant

## 2024-04-30 ENCOUNTER — Encounter (HOSPITAL_COMMUNITY)
Admission: RE | Disposition: A | Discharge status: Home / Self Care | Payer: Self-pay | Source: Home / Self Care | Attending: Orthopaedic Surgery

## 2024-04-30 DIAGNOSIS — I10 Essential (primary) hypertension: Secondary | ICD-10-CM | POA: Insufficient documentation

## 2024-04-30 DIAGNOSIS — M129 Arthropathy, unspecified: Secondary | ICD-10-CM | POA: Diagnosis not present

## 2024-04-30 DIAGNOSIS — Z471 Aftercare following joint replacement surgery: Secondary | ICD-10-CM | POA: Diagnosis not present

## 2024-04-30 DIAGNOSIS — F17210 Nicotine dependence, cigarettes, uncomplicated: Secondary | ICD-10-CM

## 2024-04-30 DIAGNOSIS — E785 Hyperlipidemia, unspecified: Secondary | ICD-10-CM

## 2024-04-30 DIAGNOSIS — F17200 Nicotine dependence, unspecified, uncomplicated: Secondary | ICD-10-CM | POA: Insufficient documentation

## 2024-04-30 DIAGNOSIS — M1611 Unilateral primary osteoarthritis, right hip: Secondary | ICD-10-CM | POA: Insufficient documentation

## 2024-04-30 DIAGNOSIS — Z96641 Presence of right artificial hip joint: Secondary | ICD-10-CM

## 2024-04-30 DIAGNOSIS — M160 Bilateral primary osteoarthritis of hip: Secondary | ICD-10-CM | POA: Diagnosis not present

## 2024-04-30 HISTORY — PX: TOTAL HIP ARTHROPLASTY: SHX124

## 2024-04-30 LAB — ABO/RH: ABO/RH(D): B POS

## 2024-04-30 SURGERY — ARTHROPLASTY, HIP, TOTAL, ANTERIOR APPROACH
Anesthesia: Monitor Anesthesia Care | Site: Hip | Laterality: Right

## 2024-04-30 MED ORDER — LACTATED RINGERS IV SOLN: INTRAVENOUS | Status: DC

## 2024-04-30 MED ORDER — HYDROMORPHONE HCL 1 MG/ML IJ SOLN
0.2500 mg | INTRAMUSCULAR | Status: DC | PRN
  Administered 2024-04-30: 0.25 mg via INTRAVENOUS

## 2024-04-30 MED ORDER — ONDANSETRON HCL 4 MG/2ML IJ SOLN
INTRAMUSCULAR | Status: DC | PRN
  Administered 2024-04-30: 4 mg via INTRAVENOUS

## 2024-04-30 MED ORDER — ACETAMINOPHEN 10 MG/ML IV SOLN
1000.0000 mg | Freq: Once | INTRAVENOUS | Status: AC
  Administered 2024-04-30: 1000 mg via INTRAVENOUS

## 2024-04-30 MED ORDER — ONDANSETRON HCL 4 MG/2ML IJ SOLN: 4.0000 mg | Freq: Once | INTRAMUSCULAR | Status: DC | PRN

## 2024-04-30 MED ORDER — HYDROMORPHONE HCL 1 MG/ML IJ SOLN
INTRAMUSCULAR | Status: AC
  Filled 2024-04-30: qty 1

## 2024-04-30 MED ORDER — MIDAZOLAM HCL 2 MG/2ML IJ SOLN
INTRAMUSCULAR | Status: DC | PRN
  Administered 2024-04-30 (×2): 1 mg via INTRAVENOUS

## 2024-04-30 MED ORDER — BUPIVACAINE IN DEXTROSE 0.75-8.25 % IT SOLN
INTRATHECAL | Status: DC | PRN
  Administered 2024-04-30: 1.8 mL via INTRATHECAL

## 2024-04-30 MED ORDER — FENTANYL CITRATE (PF) 100 MCG/2ML IJ SOLN
INTRAMUSCULAR | Status: AC
Start: 2024-04-30 — End: 2024-04-30
  Filled 2024-04-30: qty 2

## 2024-04-30 MED ORDER — TRANEXAMIC ACID-NACL 1000-0.7 MG/100ML-% IV SOLN
1000.0000 mg | INTRAVENOUS | Status: AC
  Administered 2024-04-30: 1000 mg via INTRAVENOUS
  Filled 2024-04-30: qty 100

## 2024-04-30 MED ORDER — POVIDONE-IODINE 10 % EX SWAB
2.0000 | Freq: Once | CUTANEOUS | Status: AC
  Administered 2024-04-30: 2 via TOPICAL

## 2024-04-30 MED ORDER — ACETAMINOPHEN 10 MG/ML IV SOLN
INTRAVENOUS | Status: AC
  Filled 2024-04-30: qty 100

## 2024-04-30 MED ORDER — HYDROMORPHONE HCL 1 MG/ML IJ SOLN
INTRAMUSCULAR | Status: DC | PRN
  Administered 2024-04-30: .5 mg via INTRAVENOUS

## 2024-04-30 MED ORDER — VANCOMYCIN HCL 1 G IV SOLR
INTRAVENOUS | Status: DC | PRN
  Administered 2024-04-30: 1000 mg via TOPICAL

## 2024-04-30 MED ORDER — BUPIVACAINE-MELOXICAM ER 400-12 MG/14ML IJ SOLN
INTRAMUSCULAR | Status: DC | PRN
Start: 2024-04-30 — End: 2024-04-30
  Administered 2024-04-30: 14 mL

## 2024-04-30 MED ORDER — FENTANYL CITRATE (PF) 100 MCG/2ML IJ SOLN
INTRAMUSCULAR | Status: AC
  Filled 2024-04-30: qty 2

## 2024-04-30 MED ORDER — PRONTOSAN WOUND IRRIGATION OPTIME
TOPICAL | Status: DC | PRN
  Administered 2024-04-30: 1 via TOPICAL

## 2024-04-30 MED ORDER — CHLORHEXIDINE GLUCONATE 0.12 % MT SOLN
15.0000 mL | Freq: Once | OROMUCOSAL | Status: AC
  Administered 2024-04-30: 15 mL via OROMUCOSAL
  Filled 2024-04-30: qty 15

## 2024-04-30 MED ORDER — PHENYLEPHRINE HCL-NACL 20-0.9 MG/250ML-% IV SOLN
INTRAVENOUS | Status: DC | PRN
  Administered 2024-04-30: 20 ug/min via INTRAVENOUS

## 2024-04-30 MED ORDER — HYDROMORPHONE HCL 1 MG/ML IJ SOLN
INTRAMUSCULAR | Status: AC
Start: 2024-04-30 — End: 2024-04-30
  Filled 2024-04-30: qty 0.5

## 2024-04-30 MED ORDER — PHENYLEPHRINE 80 MCG/ML (10ML) SYRINGE FOR IV PUSH (FOR BLOOD PRESSURE SUPPORT)
PREFILLED_SYRINGE | INTRAVENOUS | Status: AC
  Filled 2024-04-30: qty 10

## 2024-04-30 MED ORDER — CEFAZOLIN SODIUM-DEXTROSE 2-4 GM/100ML-% IV SOLN
2.0000 g | INTRAVENOUS | Status: AC
  Administered 2024-04-30: 2 g via INTRAVENOUS
  Filled 2024-04-30: qty 100

## 2024-04-30 MED ORDER — SODIUM CHLORIDE 0.9 % IR SOLN
Status: DC | PRN
Start: 2024-04-30 — End: 2024-04-30
  Administered 2024-04-30: 1000 mL

## 2024-04-30 MED ORDER — ONDANSETRON HCL 4 MG/2ML IJ SOLN
INTRAMUSCULAR | Status: AC
  Filled 2024-04-30: qty 2

## 2024-04-30 MED ORDER — TRANEXAMIC ACID 1000 MG/10ML IV SOLN
INTRAVENOUS | Status: DC | PRN
  Administered 2024-04-30: 2000 mg via TOPICAL

## 2024-04-30 MED ORDER — MEPERIDINE HCL 25 MG/ML IJ SOLN: 6.2500 mg | INTRAMUSCULAR | Status: DC | PRN

## 2024-04-30 MED ORDER — GLYCOPYRROLATE 0.2 MG/ML IJ SOLN
INTRAMUSCULAR | Status: DC | PRN
Start: 2024-04-30 — End: 2024-04-30
  Administered 2024-04-30: .2 mg via INTRAVENOUS

## 2024-04-30 MED ORDER — ORAL CARE MOUTH RINSE: 15.0000 mL | Freq: Once | OROMUCOSAL | Status: AC

## 2024-04-30 MED ORDER — VANCOMYCIN HCL 1000 MG IV SOLR
INTRAVENOUS | Status: AC
Start: 2024-04-30 — End: 2024-04-30
  Filled 2024-04-30: qty 20

## 2024-04-30 MED ORDER — 0.9 % SODIUM CHLORIDE (POUR BTL) OPTIME
TOPICAL | Status: DC | PRN
  Administered 2024-04-30: 500 mL

## 2024-04-30 MED ORDER — BUPIVACAINE-MELOXICAM ER 400-12 MG/14ML IJ SOLN
INTRAMUSCULAR | Status: AC
  Filled 2024-04-30: qty 1

## 2024-04-30 MED ORDER — GENTAMICIN SULFATE 40 MG/ML IJ SOLN
5.0000 mg/kg | INTRAVENOUS | Status: DC
  Filled 2024-04-30: qty 6.5

## 2024-04-30 MED ORDER — OXYCODONE HCL 5 MG PO TABS: 5.0000 mg | ORAL_TABLET | Freq: Once | ORAL | Status: AC | PRN

## 2024-04-30 MED ORDER — FENTANYL CITRATE (PF) 100 MCG/2ML IJ SOLN
25.0000 ug | INTRAMUSCULAR | Status: DC | PRN
  Administered 2024-04-30 (×3): 50 ug via INTRAVENOUS

## 2024-04-30 MED ORDER — APIXABAN 2.5 MG PO TABS
2.5000 mg | ORAL_TABLET | Freq: Two times a day (BID) | ORAL | 0 refills | Status: AC
  Filled 2024-04-30: qty 60, 30d supply, fill #0

## 2024-04-30 MED ORDER — PROPOFOL 500 MG/50ML IV EMUL
INTRAVENOUS | Status: DC | PRN
  Administered 2024-04-30: 50 ug/kg/min via INTRAVENOUS

## 2024-04-30 MED ORDER — OXYCODONE HCL 5 MG/5ML PO SOLN
5.0000 mg | Freq: Once | ORAL | Status: AC | PRN
  Administered 2024-04-30: 5 mg via ORAL

## 2024-04-30 MED ORDER — PHENYLEPHRINE 80 MCG/ML (10ML) SYRINGE FOR IV PUSH (FOR BLOOD PRESSURE SUPPORT)
PREFILLED_SYRINGE | INTRAVENOUS | Status: DC | PRN
  Administered 2024-04-30: 80 ug via INTRAVENOUS

## 2024-04-30 MED ORDER — MIDAZOLAM HCL 2 MG/2ML IJ SOLN
INTRAMUSCULAR | Status: AC
  Filled 2024-04-30: qty 2

## 2024-04-30 MED ORDER — FENTANYL CITRATE (PF) 100 MCG/2ML IJ SOLN
INTRAMUSCULAR | Status: DC | PRN
  Administered 2024-04-30 (×2): 50 ug via INTRAVENOUS

## 2024-04-30 MED ORDER — OXYCODONE HCL 5 MG/5ML PO SOLN
ORAL | Status: AC
  Filled 2024-04-30: qty 5

## 2024-04-30 SURGICAL SUPPLY — 52 items
BAG COUNTER SPONGE SURGICOUNT (BAG) ×1 IMPLANT
BAG DECANTER FOR FLEXI CONT (MISCELLANEOUS) ×1 IMPLANT
BLADE SAG 18X100X1.27 (BLADE) ×1 IMPLANT
COVER PERINEAL POST (MISCELLANEOUS) ×1 IMPLANT
COVER SURGICAL LIGHT HANDLE (MISCELLANEOUS) ×1 IMPLANT
CUP ACETAB EMPH CMTLS 50 3H (Cup) IMPLANT
DERMABOND ADVANCED .7 DNX12 (GAUZE/BANDAGES/DRESSINGS) IMPLANT
DRAPE C-ARM 42X72 X-RAY (DRAPES) ×1 IMPLANT
DRAPE POUCH INSTRU U-SHP 10X18 (DRAPES) ×1 IMPLANT
DRAPE STERI IOBAN 125X83 (DRAPES) ×1 IMPLANT
DRAPE U-SHAPE 47X51 STRL (DRAPES) ×2 IMPLANT
DRSG AQUACEL AG ADV 3.5X 6 (GAUZE/BANDAGES/DRESSINGS) IMPLANT
DRSG AQUACEL AG ADV 3.5X10 (GAUZE/BANDAGES/DRESSINGS) ×1 IMPLANT
DURAPREP 26ML APPLICATOR (WOUND CARE) ×2 IMPLANT
ELECTRODE BLDE 4.0 EZ CLN MEGD (MISCELLANEOUS) ×1 IMPLANT
ELECTRODE REM PT RTRN 9FT ADLT (ELECTROSURGICAL) ×1 IMPLANT
GLOVE BIOGEL PI IND STRL 7.0 (GLOVE) ×2 IMPLANT
GLOVE BIOGEL PI IND STRL 7.5 (GLOVE) ×5 IMPLANT
GLOVE ECLIPSE 7.0 STRL STRAW (GLOVE) ×2 IMPLANT
GLOVE SKINSENSE STRL SZ7.5 (GLOVE) ×1 IMPLANT
GLOVE SURG SYN 7.5 PF PI (GLOVE) ×2 IMPLANT
GLOVE SURG UNDER POLY LF SZ7 (GLOVE) ×3 IMPLANT
GLOVE SURG UNDER POLY LF SZ7.5 (GLOVE) ×2 IMPLANT
GOWN STRL REUS W/ TWL LRG LVL3 (GOWN DISPOSABLE) IMPLANT
GOWN STRL REUS W/ TWL XL LVL3 (GOWN DISPOSABLE) ×1 IMPLANT
GOWN STRL SURGICAL XL XLNG (GOWN DISPOSABLE) ×1 IMPLANT
GOWN TOGA ZIPPER T7+ PEEL AWAY (MISCELLANEOUS) ×1 IMPLANT
HEAD CERAMIC DELTA 36 PLUS 1.5 (Hips) IMPLANT
HOOD PEEL AWAY T7 (MISCELLANEOUS) ×1 IMPLANT
IV NS IRRIG 3000ML ARTHROMATIC (IV SOLUTION) ×1 IMPLANT
KIT BASIN OR (CUSTOM PROCEDURE TRAY) ×1 IMPLANT
LINER ACET EMPH NTRL 36X50 +4 (Liner) IMPLANT
MARKER SKIN DUAL TIP RULER LAB (MISCELLANEOUS) ×1 IMPLANT
NDL SPNL 18GX3.5 QUINCKE PK (NEEDLE) ×1 IMPLANT
NEEDLE SPNL 18GX3.5 QUINCKE PK (NEEDLE) ×1 IMPLANT
PACK TOTAL JOINT (CUSTOM PROCEDURE TRAY) ×1 IMPLANT
PACK UNIVERSAL I (CUSTOM PROCEDURE TRAY) ×1 IMPLANT
SET HNDPC FAN SPRY TIP SCT (DISPOSABLE) ×1 IMPLANT
SOLUTION PRONTOSAN WOUND 350ML (IRRIGATION / IRRIGATOR) ×1 IMPLANT
STEM FEM ACTIS HIGH SZ3 (Stem) IMPLANT
SUT ETHIBOND 2 V 37 (SUTURE) ×1 IMPLANT
SUT ETHILON 2 0 FS 18 (SUTURE) IMPLANT
SUT STRATAFIX PDS+ 0 24IN (SUTURE) IMPLANT
SUT VIC AB 0 CT1 27XBRD ANBCTR (SUTURE) ×1 IMPLANT
SUT VIC AB 1 CTX36XBRD ANBCTR (SUTURE) ×1 IMPLANT
SUT VIC AB 2-0 CT1 TAPERPNT 27 (SUTURE) ×2 IMPLANT
SYR 50ML LL SCALE MARK (SYRINGE) ×1 IMPLANT
TOWEL GREEN STERILE (TOWEL DISPOSABLE) ×1 IMPLANT
TRAY CATH INTERMITTENT SS 16FR (CATHETERS) IMPLANT
TRAY FOLEY W/BAG SLVR 16FR ST (SET/KITS/TRAYS/PACK) IMPLANT
TUBE SUCT ARGYLE STRL (TUBING) ×1 IMPLANT
YANKAUER SUCT BULB TIP NO VENT (SUCTIONS) ×1 IMPLANT

## 2024-04-30 NOTE — H&P (Signed)
 PREOPERATIVE H&P  Chief Complaint: right hip osteoarthritis  HPI: Shelley [name] is a 68 y.o. female who presents for surgical treatment of right hip osteoarthritis.  She denies any changes in medical history.  Past Surgical History:  Procedure Laterality Date  . COLONOSCOPY    . 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 Marital status: Single    Spouse name: Not on file  . Number of children: 2  . Years of education: 28  . 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  Looking to get employment with childcare.  . Occupation: Volunteer at a day care    Comment: Retired Human resources officer  Tobacco Use  . Smoking status: Some Days    Types: Cigarettes    Start date: 10/09/1990  . Smokeless tobacco: Never  . Tobacco comments:    1 cigg once a week  Vaping Use  . Vaping status: Never Used  Substance and Sexual Activity  . Alcohol use: Yes    Alcohol/week: 6.0 standard drinks of alcohol    Types: 6 Shots of liquor per week  . Drug use: Not Currently    Types: Marijuana  . Sexual activity: Yes    Birth control/protection: None  Other Topics Concern  . Not on file  Social History Narrative   Born [place] raised in [place].   Went to school for speech pathology and communication--spent time at [employer] and [employer]--2 years of college   Lives with 2 daughters, ages 97 and 73 yo.   Social Drivers of Health   Financial Resource Strain: Low Risk  (11/18/2022)   Overall Financial Resource Strain (CARDIA)   . Difficulty of Paying Living Expenses: Not hard at all  Food Insecurity: No Food Insecurity (11/18/2022)   Hunger Vital Sign   . Worried About Programme researcher, broadcasting/film/video in the Last Year: Never true   . Ran Out of Food in the Last Year: Never true  Transportation Needs: No Transportation Needs (11/18/2022)   PRAPARE -  Transportation   . Lack of Transportation (Medical): No   . Lack of Transportation (Non-Medical): No  Physical Activity: Inactive (11/18/2022)   Exercise Vital Sign   . Days of Exercise per Week: 0 days   . Minutes of Exercise per Session: 0 min  Stress: No Stress Concern Present (11/18/2022)   [employer] of Occupational Health - Occupational Stress Questionnaire   . Feeling of Stress : Not at all  Social Connections: Not on file   Family History  Problem Relation Age of Onset  . Heart disease Mother   . Hypertension Mother   . Congestive Heart Failure Mother        Cause of death  . Congestive Heart Failure Father   . Stroke Father        Cause of death  . Hypertension Father   . Alcohol abuse Brother   . Hypertension Brother   . Heart disease Maternal Grandmother   . Stroke Maternal Grandmother   . Diabetes Sister   . Hypertension Sister   . Heart disease Sister        Arrhythmia and myocarditis/Congenital Heart Disease/possible arrhythmia  . Diabetes Paternal Grandmother   . Diabetes Paternal Grandfather   . Colitis Neg Hx   . Esophageal cancer Neg Hx   .  Stomach cancer Neg Hx   . Rectal cancer Neg Hx    Allergies  Allergen Reactions  . Meloxicam      Stomach pain  . Metronidazole  Nausea And Vomiting  . Cephalexin Diarrhea    Developed diarrhea after only 2 doses.  . Ciprofloxacin  Nausea And Vomiting   Prior to Admission medications   Medication Sig Start Date End Date Taking? Authorizing Provider  Boswellia-Glucosamine-Vit D (OSTEO BI-FLEX ONE PER DAY) TABS Take 1 tablet by mouth 3 (three) times a week.   Yes [provider]  Cholecalciferol (VITAMIN D3) 25 MCG (1000 UT) CHEW Chew 1,000 Units by mouth 3 (three) times a week.   Yes [provider]  Multiple Vitamin (MULTIVITAMIN WITH MINERALS) TABS tablet Take 1 tablet by mouth daily.   Yes [provider]  pravastatin  (PRAVACHOL ) 10 MG tablet Take 1 tab PO Q Mon/Wed/Fri 08/04/23   Yes Vicci Barnie NOVAK, MD  traMADol  (ULTRAM ) 50 MG tablet Take 1 tablet (50 mg total) by mouth 2 (two) times daily as needed. Patient not taking: Reported on 04/24/2024 02/22/24  Yes Jule Ronal CROME, PA-C  amLODipine  (NORVASC ) 10 MG tablet TAKE 1 TABLET(10 MG) BY MOUTH DAILY 04/24/24   Vicci Barnie NOVAK, MD  celecoxib  (CELEBREX ) 100 MG capsule TAKE 1 CAPSULE(100 MG) BY MOUTH DAILY Patient not taking: Reported on 04/24/2024 09/20/23   Brooks, Dana, DO  docusate sodium  (COLACE) 100 MG capsule Take 1 capsule (100 mg total) by mouth daily as needed. 04/19/24 04/19/25  Jule Ronal CROME, PA-C  methocarbamol  (ROBAXIN ) 750 MG tablet Take 1 tablet (750 mg total) by mouth 3 (three) times daily as needed. 04/19/24   Jule Ronal CROME, PA-C  nicotine  polacrilex (NICORETTE ) 2 MG gum Take 1 each (2 mg total) by mouth as needed for smoking cessation. Patient not taking: Reported on 04/24/2024 08/04/23   Vicci Barnie NOVAK, MD  ondansetron  (ZOFRAN ) 4 MG tablet Take 1 tablet (4 mg total) by mouth every 8 (eight) hours as needed for nausea or vomiting. 04/19/24   Jule Ronal CROME, PA-C  oxyCODONE -acetaminophen  (PERCOCET) 5-325 MG tablet Take 1-2 tablets by mouth every 6 (six) hours as needed. To be taken after surgery 04/19/24   Jule Ronal CROME, PA-C  valsartan  (DIOVAN ) 40 MG tablet Take 1 tablet (40 mg total) by mouth daily. 04/24/24   Vicci Barnie NOVAK, MD  Vitamin D , Ergocalciferol , (DRISDOL ) 1.25 MG (50000 UNIT) CAPS capsule Take 1 capsule (50,000 Units total) by mouth every 7 (seven) days. Patient not taking: Reported on 04/24/2024 11/27/22   Vicci Barnie NOVAK, MD     Positive ROS: All other systems have been reviewed and were otherwise negative with the exception of those mentioned in the HPI and as above.  Physical Exam: General: Alert, no acute distress Cardiovascular: No pedal edema Respiratory: No cyanosis, no use of accessory musculature GI: abdomen soft Skin: No lesions in the area of chief  complaint Neurologic: Sensation intact distally Psychiatric: Patient is competent for consent with normal mood and affect Lymphatic: no lymphedema  MUSCULOSKELETAL: exam stable  Assessment: right hip osteoarthritis  Plan: Plan for Procedure(s): ARTHROPLASTY, HIP, TOTAL, ANTERIOR APPROACH  The risks benefits and alternatives were discussed with the patient including but not limited to the risks of nonoperative treatment, versus surgical intervention including infection, bleeding, nerve injury,  blood clots, cardiopulmonary complications, morbidity, mortality, among others, and they were willing to proceed.   Ozell Cummins, MD 04/30/2024 5:51 AM

## 2024-04-30 NOTE — Discharge Planning (Addendum)
 Shelley [name], BSN, RN, [facility] [phone] Pt qualifies for DME [facility]) rolling walker and bedside commode.  DME  ordered through [employer].  Ryan Pilar of [employer] notified to deliver DME to pt room prior to D/C home.      Durable Medical Equipment  (From admission, onward)           Start     Ordered   04/30/24 1146  DME Walker rolling  Once       Question:  Patient needs a walker to treat with the following condition  Answer:  History of hip replacement   04/30/24 1145   04/30/24 1146  DME 3 n 1  Once        04/30/24 1145   04/30/24 1146  DME Bedside commode  Once       Question:  Patient needs a bedside commode to treat with the following condition  Answer:  History of hip replacement   04/30/24 1145

## 2024-04-30 NOTE — Discharge Planning (Signed)
 DME rolling walker and bedside commode delivered to pt at bedside.  Tamiyah Moulin J. Debarah, BSN, RN, [facility]  IP Care Management  Nurse Case Manager  [facility] Emergency Departments  Operative Services  [phone]

## 2024-04-30 NOTE — Anesthesia Procedure Notes (Signed)
 Spinal  Patient location during procedure: OR Start time: 04/30/2024 7:25 AM End time: 04/30/2024 7:30 AM Reason for block: surgical anesthesia Staffing Anesthesiologist: Mallory Manus, MD Performed by: Mallory Manus, MD Authorized by: Mallory Manus, MD   Preanesthetic Checklist Completed: patient identified, IV checked, site marked, risks and benefits discussed, surgical consent, monitors and equipment checked, pre-op evaluation and timeout performed Spinal Block Patient position: sitting Prep: DuraPrep Patient monitoring: heart rate, cardiac monitor, continuous pulse ox and blood pressure Approach: midline Location: L3-4 Injection technique: single-shot Needle Needle type: Sprotte  Needle gauge: 24 G Needle length: 9 cm Assessment Sensory level: T4 Events: CSF return

## 2024-04-30 NOTE — Evaluation (Signed)
 Physical Therapy Evaluation Patient Details Name: Shelley [name] MRN: [id number] DOB: 1955/12/21 Today's Date: 04/30/2024  History of Present Illness  68 y.o. female presents to [facility] hospital on 04/30/2024 for elective R THA. PMH includes HLD, OA.  Clinical Impression  Pt presents to PT with deficits in functional mobility, gait, balance, strength, ROM. Pt is able to ambulate for household distances with support of the RW. Pt is also able to negotiate steps without physical assistance. PT provides education on the THA exercise packet and encourages frequent mobilization with staff assistance. PT will continue to follow if admitted.    If plan is discharge home, recommend the following: A little help with bathing/dressing/bathroom;Assistance with cooking/housework;Assist for transportation;Help with stairs or ramp for entrance   Can travel by private vehicle        Equipment Recommendations None recommended by PT  Recommendations for Other Services       Functional Status Assessment Patient has had a recent decline in their functional status and demonstrates the ability to make significant improvements in function in a reasonable and predictable amount of time.     Precautions / Restrictions Precautions Precautions: Fall Recall of Precautions/Restrictions: Intact Precaution/Restrictions Comments: direct anterior THA Restrictions Weight Bearing Restrictions Per Provider Order: Yes RLE Weight Bearing Per Provider Order: Weight bearing as tolerated      Mobility  Bed Mobility Overal bed mobility: Needs Assistance Bed Mobility: Supine to Sit     Supine to sit: Min assist, HOB elevated          Transfers Overall transfer level: Needs assistance Equipment used: Rolling walker (2 wheels) Transfers: Sit to/from Stand Sit to Stand: Supervision                Ambulation/Gait Ambulation/Gait assistance: Supervision Gait Distance (Feet): 80 Feet Assistive device: Rolling  walker (2 wheels) Gait Pattern/deviations: Step-through pattern Gait velocity: reduced Gait velocity interpretation: <1.8 ft/sec, indicate of risk for recurrent falls   General Gait Details: slowed step-through gait, reduced stance time on RLE  Stairs Stairs: Yes Stairs assistance: Contact guard assist Stair Management: Two rails, Forwards Number of Stairs: 5    Wheelchair Mobility     Tilt Bed    Modified Rankin (Stroke Patients Only)       Balance Overall balance assessment: Needs assistance Sitting-balance support: No upper extremity supported, Feet supported Sitting balance-Leahy Scale: Good     Standing balance support: Bilateral upper extremity supported, Reliant on assistive device for balance Standing balance-Leahy Scale: Poor                               Pertinent Vitals/Pain Pain Assessment Pain Assessment: 0-10 Pain Score: 7  Pain Location: R hip Pain Descriptors / Indicators: Sore Pain Intervention(s): Monitored during session    Home Living Family/patient expects to be discharged to:: Private residence Living Arrangements: Alone Available Help at Discharge: Family;Available 24 hours/day (daughter staying for the first week) Type of Home: House Home Access: Stairs to enter Entrance Stairs-Rails: Can reach both Entrance Stairs-Number of Steps: 5   Home Layout: One level Home Equipment: Agricultural consultant (2 wheels);Cane - single point;BSC/3in1 (RW and BSC delivered to pt's room prior to PT arrival)      Prior Function Prior Level of Function : Independent/Modified Independent;Driving;Working/employed                     Extremity/Trunk Assessment   Upper Extremity Assessment Upper  Extremity Assessment: Overall WFL for tasks assessed    Lower Extremity Assessment Lower Extremity Assessment: RLE deficits/detail RLE Deficits / Details: generalized post-op weakness as anticipated on POD 0    Cervical / Trunk  Assessment Cervical / Trunk Assessment: Normal  Communication   Communication Communication: No apparent difficulties    Cognition Arousal: Alert Behavior During Therapy: WFL for tasks assessed/performed   PT - Cognitive impairments: No apparent impairments                         Following commands: Intact       Cueing Cueing Techniques: Verbal cues     General Comments General comments (skin integrity, edema, etc.): VSS on RA    Exercises Other Exercises Other Exercises: education provided on surgical hip exercise packet   Assessment/Plan    PT Assessment Patient needs continued PT services  PT Problem List Decreased strength;Decreased activity tolerance;Decreased balance;Decreased mobility;Decreased knowledge of use of DME       PT Treatment Interventions DME instruction;Gait training;Stair training;Functional mobility training;Therapeutic exercise;Therapeutic activities;Balance training;Neuromuscular re-education;Patient/family education    PT Goals (Current goals can be found in the Care Plan section)  Acute Rehab PT Goals Patient Stated Goal: to return home PT Goal Formulation: With patient Time For Goal Achievement: 05/04/24 Potential to Achieve Goals: Good    Frequency 7X/week     Co-evaluation               AM-PAC PT 6 Clicks Mobility  Outcome Measure Help needed turning from your back to your side while in a flat bed without using bedrails?: A Little Help needed moving from lying on your back to sitting on the side of a flat bed without using bedrails?: A Little Help needed moving to and from a bed to a chair (including a wheelchair)?: A Little Help needed standing up from a chair using your arms (e.g., wheelchair or bedside chair)?: A Little Help needed to walk in hospital room?: A Little Help needed climbing 3-5 steps with a railing? : A Little 6 Click Score: 18    End of Session Equipment Utilized During Treatment: Gait  belt Activity Tolerance: Patient tolerated treatment well Patient left: in bed;with call bell/phone within reach Nurse Communication: Mobility status PT Visit Diagnosis: Other abnormalities of gait and mobility (R26.89);Muscle weakness (generalized) (M62.81)    Time: 8767-8693 PT Time Calculation (min) (ACUTE ONLY): 34 min   Charges:   PT Evaluation $PT Eval Low Complexity: 1 Low   PT General Charges $$ ACUTE PT VISIT: 1 Visit         Bernardino JINNY Ruth, PT, DPT Acute Rehabilitation Office [phone]   Bernardino JINNY Ruth 04/30/2024, 4:24 PM

## 2024-04-30 NOTE — Op Note (Signed)
 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 Total hip replacement; Right hip; uncemented [id number]   Surgeon: Kay Cummins, M.D.  Assist: Ronal Morna Grave, PA-C   Anesthesia: spinal  Prosthesis: Depuy Acetabulum: Emphasys gription 50 mm Femur: Actis 3 HO Head: 36 mm size: +1.5 Liner: +4 neutral Bearing Type: ceramic/poly  Total Hip Arthroplasty (Anterior Approach) Op Note:  After informed consent was obtained and the operative extremity marked in the holding area, the patient was brought back to the operating room and placed supine on the HANA table. Next, the operative extremity was prepped and draped in normal sterile fashion. Surgical timeout occurred verifying patient identification, surgical site, surgical procedure and administration of antibiotics.  A bikini incision was made over the TFL muscle belly.  A Hueter approach to the hip was performed, using the interval between tensor fascia lata and sartorius.  Dissection was carried bluntly down onto the anterior hip capsule. The lateral femoral circumflex vessels were identified and coagulated. A capsulotomy was performed and the capsular flaps tagged for later repair.  The neck osteotomy was performed.. The femoral head was removed which showed complete loss of articular cartilage, large osteophytes, the acetabular rim was cleared of soft tissue and osteophytes and attention was turned to reaming the acetabulum.  Sequential reaming was performed under fluoroscopic guidance down to the floor of the cotyloid fossa. We reamed to a size 50 mm, and then impacted the acetabular shell. A +4 neutral liner was then placed after irrigation and attention turned to the femur.  After placing the femoral hook, the leg was taken  to externally rotated, extended and adducted position taking care to perform soft tissue releases to allow for adequate mobilization of the femur. Soft tissue was cleared from the shoulder of the greater trochanter and the hook elevator used to improve exposure of the proximal femur.  Lateral bone from the shoulder was rasped away for relief.  Sequential broaching performed up to a size 3.  Initially a standard trial neck and +1.5 head were placed. The leg was brought back up to neutral and the construct reduced.  The position and sizing of components, offset and leg lengths were checked using fluoroscopy.  She needed more offset to restore her native anatomy.  Leg lengths were equal.  Stability of the construct was checked in 45 degrees of hip extension and 90 degrees of external rotation without any subluxation, shuck or impingement of prosthesis. We dislocated the prosthesis, dropped the leg back into position, removed trial components, and irrigated copiously. The final stem with high offset neck and +1.5 head was then placed, the leg brought back up, the system reduced and fluoroscopy used to verify positioning.  Antibiotic irrigation was placed in the surgical wound.   We irrigated, obtained hemostasis and closed the capsule using #2 ethibond suture.  A topical mixture of 0.25% bupivacaine  and meloxicam  was placed deep to the fascia.  One gram of vancomycin  powder was placed in the surgical bed.   One gram of topical tranexamic acid  was injected into the joint.  The fascia was closed with #1 stratafix, the deep fat layer was closed with 0 vicryl, the subcutaneous layers closed with 2.0 Vicryl Plus and the skin closed with 2.0 nylon and dermabond. A sterile dressing was applied. The  patient was awakened in the operating room and taken to recovery in stable condition.  All sponge, needle, and instrument counts were correct at the end of the case.   Morna Grave, my PA, was a medical necessity for  opening, closing, limb positioning, retracting, exposing, and overall facilitation and timely completion of the surgery.  Position: supine  Complications: see description of procedure.  Time Out: performed   Drains/Packing: none  Estimated blood loss: see anesthesia record  Returned to Recovery Room: in good condition.   Antibiotics: yes   Mechanical VTE (DVT) Prophylaxis: sequential compression devices, TED thigh-high  Chemical VTE (DVT) Prophylaxis: aspirin   Fluid Replacement: see anesthesia record  Specimens Removed: 1 to pathology   Sponge and Instrument Count Correct? yes   PACU: portable radiograph - low AP   Plan/RTC: Return in 2 weeks for suture removal. Weight Bearing/Load Lower Extremity: full  Hip precautions: none Suture Removal: 2 weeks   N. Ozell Cummins, MD [facility] 8:41 AM   Implant Name Type Inv. Item Serial No. Manufacturer Lot No. LRB No. Used Action  EMPHASYS ACETABULAR SHELL THREE-HOLE    [employer] [id number] Right 1 Implanted  EMPHASYS POLYETHYLENE LINER 4+     [id number] Right 1 Implanted  STEM FEM ACTIS HIGH SZ3 - [id number] Stem STEM FEM ACTIS HIGH SZ3  [employer] [id number] Right 1 Implanted  HEAD CERAMIC DELTA 36 PLUS 1.5 - [id number] Hips HEAD CERAMIC DELTA 36 PLUS 1.5  [employer] [id number] Right 1 Implanted

## 2024-04-30 NOTE — Discharge Instructions (Signed)

## 2024-04-30 NOTE — Transfer of Care (Signed)
 Immediate Anesthesia Transfer of Care Note  Patient: Shelley [name]  Procedure(s) Performed: ARTHROPLASTY, HIP, TOTAL, ANTERIOR APPROACH (Right: Hip)  Patient Location: PACU  Anesthesia Type:Spinal  Level of Consciousness: awake and patient cooperative  Airway & Oxygen Therapy: Patient Spontanous Breathing  Post-op Assessment: Report given to RN and Post -op Vital signs reviewed and stable  Post vital signs: Reviewed and stable  Last Vitals:  Vitals Value Taken Time  BP 110/67 04/30/24 09:09  Temp    Pulse 67 04/30/24 09:15  Resp 21 04/30/24 09:15  SpO2 95 % 04/30/24 09:15  Vitals shown include unfiled device data.  Last Pain:  Vitals:   04/30/24 0623  TempSrc:   PainSc: 7          Complications: No notable events documented.

## 2024-04-30 NOTE — Progress Notes (Signed)
 Wasted 0.20ml of Dilaudid  with Bernardino Liverpool RN.

## 2024-04-30 NOTE — Anesthesia Postprocedure Evaluation (Signed)
 Anesthesia Post Note  Patient: Shelley [name]  Procedure(s) Performed: ARTHROPLASTY, HIP, TOTAL, ANTERIOR APPROACH (Right: Hip)     Patient location during evaluation: PACU Anesthesia Type: MAC Level of consciousness: awake and alert Pain management: pain level controlled Vital Signs Assessment: post-procedure vital signs reviewed and stable Respiratory status: spontaneous breathing, nonlabored ventilation, respiratory function stable and patient connected to nasal cannula oxygen Cardiovascular status: stable and blood pressure returned to baseline Postop Assessment: no apparent nausea or vomiting Anesthetic complications: no   No notable events documented.  Last Vitals:  Vitals:   04/30/24 0909 04/30/24 0915  BP: 110/67 136/87  Pulse: 65 66  Resp: 15 18  Temp:    SpO2: 96% 94%    Last Pain:  Vitals:   04/30/24 0623  TempSrc:   PainSc: 7                  Miyuki Rzasa

## 2024-05-01 ENCOUNTER — Other Ambulatory Visit: Payer: Self-pay | Admitting: *Deleted

## 2024-05-01 ENCOUNTER — Other Ambulatory Visit: Payer: Self-pay | Admitting: Physician Assistant

## 2024-05-01 DIAGNOSIS — M1611 Unilateral primary osteoarthritis, right hip: Secondary | ICD-10-CM

## 2024-05-01 DIAGNOSIS — Z96641 Presence of right artificial hip joint: Secondary | ICD-10-CM

## 2024-05-01 NOTE — Discharge Summary (Signed)
 Patient ID: Shelley [name] MRN: [id number] DOB/AGE: 03/04/1956 68 y.o.  Admit date: 04/30/2024 Discharge date: 04/30/24  Admission Diagnoses:  Primary osteoarthritis of right hip  Discharge Diagnoses:  Principal Problem:   Primary osteoarthritis of right hip Active Problems:   Status post total replacement of right hip   Past Medical History:  Diagnosis Date   Allergy    Environmental and Seasonal:  Pharyngeal drainage, cough and eye symptoms   Anemia    Arthritis    Elevated blood pressure reading without diagnosis of hypertension 2016   Hyperlipidemia    Hypertension    Pneumonia 2019   Tobacco use     Surgeries: Procedure(s): ARTHROPLASTY, HIP, TOTAL, ANTERIOR APPROACH on 04/30/2024   Consultants (if any):   Discharged Condition: Improved  Hospital Course: Shelley [name] is an 68 y.o. female who was admitted 04/30/2024 with a diagnosis of Primary osteoarthritis of right hip and went to the operating room on 04/30/2024 and underwent the above named procedures.    She was given perioperative antibiotics:  Anti-infectives (From admission, onward)    Start     Dose/Rate Route Frequency Ordered Stop   04/30/24 0834  vancomycin  (VANCOCIN ) powder  Status:  Discontinued          As needed 04/30/24 0834 04/30/24 0906   04/30/24 0600  gentamicin  (GARAMYCIN ) 260 mg in dextrose  5 % 100 mL IVPB  Status:  Discontinued        5 mg/kg  51.7 kg 106.5 mL/hr over 60 Minutes Intravenous On call to O.R. 04/30/24 0557 04/30/24 1949   04/30/24 0600  ceFAZolin  (ANCEF ) IVPB 2g/100 mL premix        2 g 200 mL/hr over 30 Minutes Intravenous On call to O.R. 04/30/24 9442 04/30/24 0731     .  She was given sequential compression devices, early ambulation, and appropriate chemoprophylaxis for DVT prophylaxis.  She benefited maximally from the hospital stay and there were no complications.    Recent vital signs:  Vitals:   04/30/24 1215 04/30/24 1230  BP: (!) 101/38 (!) 107/54   Pulse: 68 61  Resp: 18 15  Temp:  97.8 F (36.6 C)  SpO2: 96% 98%    Recent laboratory studies:  Lab Results  Component Value Date   HGB 14.3 04/23/2024   HGB 15.3 08/04/2023   HGB 13.8 11/26/2022   Lab Results  Component Value Date   WBC 4.8 04/23/2024   PLT 258 04/23/2024   Lab Results  Component Value Date   INR 1.26 07/25/2018   Lab Results  Component Value Date   NA 138 04/23/2024   K 4.2 04/23/2024   CL 104 04/23/2024   CO2 27 04/23/2024   BUN 12 04/23/2024   CREATININE 0.50 04/23/2024   GLUCOSE 110 (H) 04/23/2024    Discharge Medications:   Allergies as of 04/30/2024       Reactions   Meloxicam     Stomach pain   Metronidazole  Nausea And Vomiting   Cephalexin Diarrhea   Developed diarrhea after only 2 doses.   Ciprofloxacin  Nausea And Vomiting        Medication List     STOP taking these medications    celecoxib  100 MG capsule Commonly known as: CELEBREX    nicotine  polacrilex 2 MG gum Commonly known as: NICORETTE    traMADol  50 MG tablet Commonly known as: ULTRAM    Vitamin D  (Ergocalciferol ) 1.25 MG (50000 UNIT) Caps capsule Commonly known as: DRISDOL   TAKE these medications    amLODipine  10 MG tablet Commonly known as: NORVASC  TAKE 1 TABLET(10 MG) BY MOUTH DAILY   docusate sodium  100 MG capsule Commonly known as: Colace Take 1 capsule (100 mg total) by mouth daily as needed.   Eliquis  2.5 MG Tabs tablet Generic drug: apixaban  Take 1 tablet (2.5 mg total) by mouth 2 (two) times daily for 30 days after surgery to prevent blood clots   methocarbamol  750 MG tablet Commonly known as: ROBAXIN  Take 1 tablet (750 mg total) by mouth 3 (three) times daily as needed.   multivitamin with minerals Tabs tablet Take 1 tablet by mouth daily.   ondansetron  4 MG tablet Commonly known as: Zofran  Take 1 tablet (4 mg total) by mouth every 8 (eight) hours as needed for nausea or vomiting.   Osteo Bi-Flex One Per Day Tabs Take 1  tablet by mouth 3 (three) times a week.   oxyCODONE -acetaminophen  5-325 MG tablet Commonly known as: Percocet Take 1-2 tablets by mouth every 6 (six) hours as needed. To be taken after surgery   pravastatin  10 MG tablet Commonly known as: PRAVACHOL  Take 1 tab PO Q Mon/Wed/Fri   valsartan  40 MG tablet Commonly known as: DIOVAN  Take 1 tablet (40 mg total) by mouth daily.   Vitamin D3 25 MCG (1000 UT) Chew Chew 1,000 Units by mouth 3 (three) times a week.        Diagnostic Studies: DG Pelvis Portable Result Date: 04/30/2024 CLINICAL DATA:  Postoperative for right total hip replacement EXAM: PORTABLE PELVIS 1-2 VIEWS COMPARISON:  04/30/2024 intraoperative images FINDINGS: A right total hip prosthesis is in place without appreciable periprosthetic fracture or acute complicating feature. Severe degenerative left hip arthropathy with full-thickness loss of craniocaudad articular cartilage and substantial femoral head and acetabular spurring. IMPRESSION: 1. Right total hip prosthesis without acute complicating feature. 2. Severe degenerative left hip arthropathy. Electronically Signed   By: Ryan Salvage M.D.   On: 04/30/2024 11:01   DG HIP UNILAT WITH PELVIS 1V RIGHT Result Date: 04/30/2024 CLINICAL DATA:  Right total hip arthroplasty EXAM: DG HIP (WITH OR WITHOUT PELVIS) 1V RIGHT COMPARISON:  02/21/2024 FINDINGS: Fluoroscopic spot images are provided. The initial 2 images demonstrate markedly severe osteoarthritis of the right hip. Subsequent images demonstrate a right total hip prosthesis in place, without visible periprosthetic fracture or acute complicating feature. There is also severe degenerative left hip arthropathy. IMPRESSION: 1. Right total hip prosthesis in place, without visible periprosthetic fracture or acute complicating feature. 2. Severe degenerative left hip arthropathy. Electronically Signed   By: Ryan Salvage M.D.   On: 04/30/2024 11:00   DG C-Arm 1-60 Min-No  Report Result Date: 04/30/2024 Fluoroscopy was utilized by the requesting physician.  No radiographic interpretation.   DG C-Arm 1-60 Min-No Report Result Date: 04/30/2024 Fluoroscopy was utilized by the requesting physician.  No radiographic interpretation.    Disposition:      Follow-up Information     Jule Ronal CROME, PA-C. Schedule an appointment as soon as possible for a visit in 2 week(s).   Specialty: Orthopedic Surgery Contact information: [address] [place]  [place] [address] [phone]                  Signed: Ozell Cummins 05/01/2024, 3:28 PM

## 2024-05-02 ENCOUNTER — Telehealth: Payer: Self-pay

## 2024-05-02 ENCOUNTER — Encounter (HOSPITAL_COMMUNITY): Payer: Self-pay | Admitting: Orthopaedic Surgery

## 2024-05-02 DIAGNOSIS — M1611 Unilateral primary osteoarthritis, right hip: Secondary | ICD-10-CM | POA: Diagnosis not present

## 2024-05-02 NOTE — Telephone Encounter (Signed)
 Patient called this morning and stated that she needs to find out about her home health. Stated she had surgery on Monday 04/30/24 and hasn't heard from anyone about therapy.  Please call and advise

## 2024-05-02 NOTE — Telephone Encounter (Signed)
 Dr.Xu, are you wanting her to have HHPT? Or is she okay to walk? If so, Tylene do you know anyone that accepts her insurance?

## 2024-05-02 NOTE — Telephone Encounter (Signed)
 Thank you :)

## 2024-05-02 NOTE — Telephone Encounter (Signed)
 We can get her 2-3 sessions of HHPT please. Thanks.

## 2024-05-03 ENCOUNTER — Telehealth: Payer: Self-pay

## 2024-05-03 NOTE — Telephone Encounter (Signed)
 Arley with [facility] saw patient for her HHPT evaluation today and stated that patient has a soft large bubble on the outside of the dressing.  No warmness or redness, dressing still in place.  Patient had right hip surgery on 04/30/2024.  Cb# [phone].  Please advise.  Thank you.

## 2024-05-03 NOTE — Telephone Encounter (Signed)
 Tried to call back to get more information. No answer. LMOM for him to call back so we can get some more information.

## 2024-05-03 NOTE — Telephone Encounter (Signed)
 Called and communicated all this with therapist and patient.

## 2024-05-03 NOTE — Telephone Encounter (Signed)
 Spoke with therapist. He is describing what sounds like a seroma near the incision area. He has instructed patient to hold off on any exercises until she hears from us . What do you want me to advise patient and therapist?

## 2024-05-03 NOTE — Telephone Encounter (Signed)
 Continue with PT.  Ice the seroma.  Thanks.

## 2024-05-04 ENCOUNTER — Telehealth: Payer: Self-pay

## 2024-05-04 ENCOUNTER — Telehealth: Payer: Self-pay | Admitting: Orthopaedic Surgery

## 2024-05-04 ENCOUNTER — Other Ambulatory Visit: Payer: Self-pay | Admitting: Physician Assistant

## 2024-05-04 MED ORDER — OXYCODONE-ACETAMINOPHEN 5-325 MG PO TABS: 1.0000 | ORAL_TABLET | Freq: Three times a day (TID) | ORAL | 0 refills | Status: AC | PRN

## 2024-05-04 NOTE — Telephone Encounter (Signed)
 Pt called wanting to know if she can take her muscle relaxer 4 times a day versus 3 times. She is also stating that her swelling has not gone down since her surgery.

## 2024-05-04 NOTE — Telephone Encounter (Signed)
 Called and notified patient.

## 2024-05-04 NOTE — Telephone Encounter (Signed)
 Patient called and needs a refill on pain medication. She also ask if she could take a extra muscle relaxer?SABRA CB#[phone]

## 2024-05-04 NOTE — Telephone Encounter (Signed)
 Notified patient.

## 2024-05-04 NOTE — Telephone Encounter (Signed)
 Sent in percocet.  Can take muscle relaxer every 6 hours, but only one at a time

## 2024-05-04 NOTE — Telephone Encounter (Signed)
 It will take 8-12 weeks for the swelling to completely resolve.  4 times is fine.  Make sure it's the 500 mg one and not 750 mg.  Thanks.

## 2024-05-05 ENCOUNTER — Encounter: Payer: Self-pay | Admitting: Orthopaedic Surgery

## 2024-05-07 ENCOUNTER — Encounter: Payer: Self-pay | Admitting: Orthopaedic Surgery

## 2024-05-07 ENCOUNTER — Telehealth: Payer: Self-pay | Admitting: Orthopaedic Surgery

## 2024-05-07 ENCOUNTER — Telehealth: Payer: Self-pay

## 2024-05-07 NOTE — Telephone Encounter (Signed)
 Called her this evening.

## 2024-05-07 NOTE — Telephone Encounter (Signed)
 Pt has called several time about her pains levels and scared that if we closed she will not get a return call. Pt states her pains are off the charts and need Dr Jerri to call her in a stronger medication and call her ASAP. Please call this pt when this message is seen. Pt has also left several mychart messages and spoke to triage. Pt phone number is [phone].

## 2024-05-08 NOTE — Telephone Encounter (Signed)
 Dr.Xu spoke with patient yesterday evening.

## 2024-05-09 ENCOUNTER — Encounter: Payer: Self-pay | Admitting: Orthopaedic Surgery

## 2024-05-14 ENCOUNTER — Other Ambulatory Visit: Payer: Self-pay | Admitting: Physician Assistant

## 2024-05-14 ENCOUNTER — Telehealth: Payer: Self-pay | Admitting: Orthopaedic Surgery

## 2024-05-14 MED ORDER — HYDROCODONE-ACETAMINOPHEN 5-325 MG PO TABS: 1.0000 | ORAL_TABLET | Freq: Three times a day (TID) | ORAL | 0 refills | Status: DC | PRN

## 2024-05-14 NOTE — Telephone Encounter (Signed)
 Patient called and needs a refill on Oxycodone . CB#[phone]

## 2024-05-14 NOTE — Telephone Encounter (Signed)
 Notified patient.

## 2024-05-14 NOTE — Telephone Encounter (Signed)
Weaning to norco and sent in

## 2024-05-15 ENCOUNTER — Encounter: Payer: Self-pay | Admitting: Physician Assistant

## 2024-05-15 ENCOUNTER — Ambulatory Visit: Admitting: Physician Assistant

## 2024-05-15 DIAGNOSIS — Z96641 Presence of right artificial hip joint: Secondary | ICD-10-CM

## 2024-05-15 NOTE — Progress Notes (Signed)
 Post-Op Visit Note   Patient: Shelley [name]           Date of Birth: Jul 29, 1956           MRN: [id number] Visit Date: 05/15/2024 PCP: Louder Barnie NOVAK, MD   Assessment & Plan:  Chief Complaint:  Chief Complaint  Patient presents with   Right Hip - Follow-up    Right total hip arthroplasty 04/30/2024   Visit Diagnoses:  1. Status post total replacement of right hip     Plan: Patient is a pleasant 68 year old female who comes in today 2 weeks status post right total hip replacement 04/30/2024.  She has been doing okay but has been in a fair amount of pain.  She has been unable to pick up her recent prescription for Norco as she has lost her ID.  She had 2 sessions of home health PT and is ambulating with a walker.  She has been compliant taking her Eliquis  twice daily for DVT prophylaxis.  Examination of her right hip reveals a well-healed surgical incision with nylon sutures in place.  No evidence of infection or cellulitis.  Calves are soft nontender.  She is neurovascular intact distally.  Today, sutures were removed and Steri-Strips applied.  She will continue with her Eliquis  for the next 2 weeks and then transition to a baby aspirin twice daily for 2 weeks.  Postoperative instructions provided.  Follow-up in 4 weeks for repeat evaluation and AP pelvis x-rays.  Call with concerns or questions.  Follow-Up Instructions: Return in about 4 weeks (around 06/12/2024).   Orders:  No orders of the defined types were placed in this encounter.  No orders of the defined types were placed in this encounter.   Imaging: No new imaging  PMFS History: Patient Active Problem List   Diagnosis Date Noted   Status post total replacement of right hip 04/30/2024   Primary osteoarthritis of right hip 03/02/2022   Primary osteoarthritis of left hip 03/02/2022   Primary osteoarthritis of both hips 02/19/2022   Essential hypertension 09/11/2021   Light smoker 09/11/2021   Hyperlipidemia  09/11/2021   Hip pain, bilateral 09/11/2021   Chronic pain of both knees 09/11/2021   Dyspnea 03/25/2021   CAP (community acquired pneumonia) 07/25/2018   Tobacco use 07/25/2018   Elevated bilirubin 07/25/2018   Alcohol use 07/25/2018   Environmental and seasonal allergies 10/16/2015   Past Medical History:  Diagnosis Date   Allergy    Environmental and Seasonal:  Pharyngeal drainage, cough and eye symptoms   Anemia    Arthritis    Elevated blood pressure reading without diagnosis of hypertension 2016   Hyperlipidemia    Hypertension    Pneumonia 2019   Tobacco use     Family History  Problem Relation Age of Onset   Heart disease Mother    Hypertension Mother    Congestive Heart Failure Mother        Cause of death   Congestive Heart Failure Father    Stroke Father        Cause of death   Hypertension Father    Alcohol abuse Brother    Hypertension Brother    Heart disease Maternal Grandmother    Stroke Maternal Grandmother    Diabetes Sister    Hypertension Sister    Heart disease Sister        Arrhythmia and myocarditis/Congenital Heart Disease/possible arrhythmia   Diabetes Paternal Grandmother    Diabetes Paternal Actor  Colitis Neg Hx    Esophageal cancer Neg Hx    Stomach cancer Neg Hx    Rectal cancer Neg Hx     Past Surgical History:  Procedure Laterality Date   COLONOSCOPY     TOTAL HIP ARTHROPLASTY Right 04/30/2024   Procedure: ARTHROPLASTY, HIP, TOTAL, ANTERIOR APPROACH;  Surgeon: Jerri Kay HERO, MD;  Location: [facility] OR;  Service: Orthopedics;  Laterality: Right;   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  Looking to get employment with childcare.   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

## 2024-05-26 ENCOUNTER — Encounter: Payer: Self-pay | Admitting: Orthopaedic Surgery

## 2024-05-28 ENCOUNTER — Other Ambulatory Visit: Payer: Self-pay | Admitting: Physician Assistant

## 2024-05-28 MED ORDER — HYDROCODONE-ACETAMINOPHEN 5-325 MG PO TABS: 1.0000 | ORAL_TABLET | Freq: Two times a day (BID) | ORAL | 0 refills | Status: DC | PRN

## 2024-05-28 NOTE — Telephone Encounter (Signed)
  Right total hip arthroplasty 04/30/2024

## 2024-05-28 NOTE — Telephone Encounter (Signed)
 I sent in norco again, but unsure if she was ever able to pick up last rx.  If she still cannot get the narcotics, I would recommend tylenol  otc, but do not exceed amount on bottle.  I would be careful with nsaids as we have her on aspirin.

## 2024-05-29 NOTE — Telephone Encounter (Signed)
 I would not lift more than 20lbs and as far as walking, I would let pain be her guide

## 2024-06-12 ENCOUNTER — Ambulatory Visit (INDEPENDENT_AMBULATORY_CARE_PROVIDER_SITE_OTHER)

## 2024-06-12 ENCOUNTER — Ambulatory Visit (INDEPENDENT_AMBULATORY_CARE_PROVIDER_SITE_OTHER): Admitting: Sports Medicine

## 2024-06-12 ENCOUNTER — Other Ambulatory Visit: Payer: Self-pay

## 2024-06-12 ENCOUNTER — Encounter: Payer: Self-pay | Admitting: Sports Medicine

## 2024-06-12 ENCOUNTER — Ambulatory Visit (INDEPENDENT_AMBULATORY_CARE_PROVIDER_SITE_OTHER): Admitting: Physician Assistant

## 2024-06-12 DIAGNOSIS — Z96641 Presence of right artificial hip joint: Secondary | ICD-10-CM

## 2024-06-12 DIAGNOSIS — M1612 Unilateral primary osteoarthritis, left hip: Secondary | ICD-10-CM

## 2024-06-12 MED ORDER — HYDROCODONE-ACETAMINOPHEN 5-325 MG PO TABS: 1.0000 | ORAL_TABLET | Freq: Every day | ORAL | 0 refills | Status: AC | PRN

## 2024-06-12 MED ORDER — METHYLPREDNISOLONE ACETATE 40 MG/ML IJ SUSP
80.0000 mg | INTRAMUSCULAR | Status: AC | PRN
  Administered 2024-06-12: 80 mg via INTRA_ARTICULAR

## 2024-06-12 MED ORDER — LIDOCAINE HCL 1 % IJ SOLN
4.0000 mL | INTRAMUSCULAR | Status: AC | PRN
  Administered 2024-06-12: 4 mL

## 2024-06-12 NOTE — Progress Notes (Signed)
 Post-Op Visit Note   Patient: Shelley [name]           Date of Birth: [birth year]           MRN: [id number] Visit Date: 06/12/2024 PCP: Louder Barnie NOVAK, MD   Assessment & Plan:  Chief Complaint:  Chief Complaint  Patient presents with   Right Hip - Pain    Right total hip arthroplasty 04/30/2024   Visit Diagnoses:  1. Status post total replacement of right hip   2. Unilateral primary osteoarthritis, left hip     Plan: Patient is a pleasant 68 year old female who comes in today 6 weeks status post right total hip replacement 04/30/2024.  She has been doing well with her right hip until a few weeks ago.  She has started to notice increased pain into the groin region.  She denies any injury or change in activity but does note that she has began having pain to the left hip which is affecting her gait.  She does have a history of left hip osteoarthritis and has undergone left hip cortisone injection back in November of last year with good relief.  Not quite ready to proceed with left total hip replacement surgery.  Examination of her right hip reveals mild discomfort with hip flexion.  No pain with logroll.  Left hip exam reveals painful logroll and FADIR testing.  She is neurovascular intact distally.  At this point, I feel her increased pain in the right hip is likely from ambulating with an altered gait secondary to her left hip arthritis flare.  We have discussed referral to Dr. Burnetta for ultrasound-guided cortisone injection left hip.  For her right hip, she will continue to advance with activity as tolerated.  Dental prophylaxis reinforced.  Follow-up in 6 weeks for recheck of her right hip.  Call with concerns or questions.  Follow-Up Instructions: Return in about 6 weeks (around 07/24/2024).   Orders:  Orders Placed This Encounter  Procedures   XR Pelvis 1-2 Views   Meds ordered this encounter  Medications   HYDROcodone -acetaminophen  (NORCO/VICODIN) 5-325 MG tablet    Sig:  Take 1-2 tablets by mouth daily as needed.    Dispense:  20 tablet    Refill:  0    Imaging: XR Pelvis 1-2 Views Result Date: 06/12/2024 Well-seated prosthesis without complication   PMFS History: Patient Active Problem List   Diagnosis Date Noted   Status post total replacement of right hip 04/30/2024   Primary osteoarthritis of right hip 03/02/2022   Primary osteoarthritis of left hip 03/02/2022   Primary osteoarthritis of both hips 02/19/2022   Essential hypertension 09/11/2021   Light smoker 09/11/2021   Hyperlipidemia 09/11/2021   Hip pain, bilateral 09/11/2021   Chronic pain of both knees 09/11/2021   Dyspnea 03/25/2021   CAP (community acquired pneumonia) 07/25/2018   Tobacco use 07/25/2018   Elevated bilirubin 07/25/2018   Alcohol use 07/25/2018   Environmental and seasonal allergies 10/16/2015   Past Medical History:  Diagnosis Date   Allergy    Environmental and Seasonal:  Pharyngeal drainage, cough and eye symptoms   Anemia    Arthritis    Elevated blood pressure reading without diagnosis of hypertension 2016   Hyperlipidemia    Hypertension    Pneumonia 2019   Tobacco use     Family History  Problem Relation Age of Onset   Heart disease Mother    Hypertension Mother    Congestive Heart Failure Mother  Cause of death   Congestive Heart Failure Father    Stroke Father        Cause of death   Hypertension Father    Alcohol abuse Brother    Hypertension Brother    Heart disease Maternal Grandmother    Stroke Maternal Grandmother    Diabetes Sister    Hypertension Sister    Heart disease Sister        Arrhythmia and myocarditis/Congenital Heart Disease/possible arrhythmia   Diabetes Paternal Grandmother    Diabetes Paternal Grandfather    Colitis Neg Hx    Esophageal cancer Neg Hx    Stomach cancer Neg Hx    Rectal cancer Neg Hx     Past Surgical History:  Procedure Laterality Date   COLONOSCOPY     TOTAL HIP ARTHROPLASTY Right  04/30/2024   Procedure: ARTHROPLASTY, HIP, TOTAL, ANTERIOR APPROACH;  Surgeon: Jerri Kay HERO, MD;  Location: [facility] OR;  Service: Orthopedics;  Laterality: Right;   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  Looking to get employment with childcare.   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

## 2024-06-12 NOTE — Progress Notes (Signed)
   Procedure Note  Patient: Shelley [name]             Date of Birth: 09/23/1956           MRN: [id number]             Visit Date: 06/12/2024  Procedures: Visit Diagnoses:  1. Unilateral primary osteoarthritis, left hip    Large Joint Inj: L hip joint on 06/12/2024 10:08 AM Indications: pain Details: 22 G 3.5 in needle, ultrasound-guided anterior approach Medications: 4 mL lidocaine  1 %; 80 mg methylPREDNISolone  acetate 40 MG/ML Outcome: tolerated well, no immediate complications  Procedure: US -guided intra-articular hip injection, left After discussion on risks/benefits/indications and informed verbal consent was obtained, a timeout was performed. Patient was lying supine on exam table. The hip was cleaned with betadine  and alcohol swabs . Then utilizing ultrasound guidance, the patient's femoral head and neck junction was identified and subsequently injected with 4:2 lidocaine :depomedrol via an in-plane approach with ultrasound visualization of the injectate administered into the hip joint. Patient tolerated procedure well without immediate complications.  Procedure, treatment alternatives, risks and benefits explained, specific risks discussed. Consent was given by the patient. Immediately prior to procedure a time out was called to verify the correct patient, procedure, equipment, support staff and site/side marked as required. Patient was prepped and draped in the usual sterile fashion.     - patient tolerated procedure well, discussed post-injection protocol - follow-up with Morna Nadene Cummins as indicated; I am happy to see them as needed  Lonell Sprang, DO Primary Care Sports Medicine Physician  [facility]  This note was dictated using Dragon naturally speaking software and may contain errors in syntax, spelling, or content which have not been identified prior to signing this note.

## 2024-06-13 ENCOUNTER — Encounter: Payer: Self-pay | Admitting: Internal Medicine

## 2024-06-13 ENCOUNTER — Encounter: Payer: Self-pay | Admitting: Orthopaedic Surgery

## 2024-06-13 NOTE — Telephone Encounter (Signed)
 Patient had a right THA 04/30/2024

## 2024-07-01 ENCOUNTER — Telehealth: Payer: Self-pay | Admitting: Internal Medicine

## 2024-07-01 NOTE — Telephone Encounter (Signed)
-----   Message from Ozell Cummins sent at 06/29/2024  9:05 AM EDT ----- Regarding: RE: S/P THR Hi Dr. Vicci, thanks for reaching out.  I think it's fine not to take any antibiotics prior to a colonoscopy.  I used to recommend it for 2 years after a total joint but then I realized it's difficult to take an antibiotic for a colonoscopy because they have to be NPO. ----- Message ----- From: Vicci Barnie NOVAK, MD Sent: 06/28/2024  11:38 PM EDT To: Kay CHRISTELLA Cummins, MD; Ronal LITTIE Grave, PA-C Subject: S/P THR                                        I am the PCP for this pt. She is scheduled to have colonoscopy 08/02/24. She called in stating she was told she needs antibiotics prior to the colonoscopy because of her [facility] 04/2024. I wanted to check with you about this as I have not been doing this as standard practice. If she does need abx, what would you advise?

## 2024-08-01 ENCOUNTER — Encounter: Payer: Self-pay | Admitting: Internal Medicine

## 2024-08-01 ENCOUNTER — Encounter: Payer: Self-pay | Admitting: Orthopaedic Surgery

## 2024-08-02 ENCOUNTER — Ambulatory Visit: Admitting: Gastroenterology

## 2024-08-02 NOTE — Telephone Encounter (Signed)
 Please see. She has some allergies

## 2024-08-02 NOTE — Telephone Encounter (Signed)
 We don't need to do abx for colonoscopy.  Thanks for checking.

## 2024-08-02 NOTE — Progress Notes (Deleted)
 SABRA

## 2024-08-02 NOTE — Telephone Encounter (Signed)
 It looks like she may be needing for colonoscopy.  Can you double check.  If so, xu no longer recommends for colonoscopy

## 2024-08-06 ENCOUNTER — Encounter: Payer: Self-pay | Admitting: Radiology

## 2024-08-09 ENCOUNTER — Telehealth: Payer: Self-pay | Admitting: *Deleted

## 2024-08-09 NOTE — Telephone Encounter (Signed)
 I spoke to patient today for s/p call for right THA. Doing well from this. Her left hip really bothering her. Lauren working on appt to get her in to discuss. In meantime, she asked if she could have something for pain for other hip. I told her I'd ask. Thanks .

## 2024-08-10 ENCOUNTER — Other Ambulatory Visit: Payer: Self-pay | Admitting: Physician Assistant

## 2024-08-10 MED ORDER — TRAMADOL HCL 50 MG PO TABS: 50.0000 mg | ORAL_TABLET | Freq: Two times a day (BID) | ORAL | 0 refills | Status: DC | PRN

## 2024-08-10 NOTE — Telephone Encounter (Signed)
 Called patient. She is aware. And already has appt scheduled with XU.

## 2024-08-10 NOTE — Telephone Encounter (Signed)
 I sent in tramadol .  Will you make sure she is seeing xu for her fu of tha/and to discuss other tha?

## 2024-08-16 ENCOUNTER — Ambulatory Visit: Admitting: Orthopaedic Surgery

## 2024-08-23 ENCOUNTER — Ambulatory Visit: Admitting: Orthopaedic Surgery

## 2024-09-03 ENCOUNTER — Ambulatory Visit: Payer: Self-pay | Admitting: Internal Medicine

## 2024-09-17 ENCOUNTER — Telehealth: Payer: Self-pay | Admitting: Orthopaedic Surgery

## 2024-09-17 MED ORDER — TRAMADOL HCL 50 MG PO TABS: 50.0000 mg | ORAL_TABLET | Freq: Every day | ORAL | 0 refills | Status: AC | PRN

## 2024-09-17 NOTE — Telephone Encounter (Signed)
 Pt called requesting a refill of tramadol . Please send to [facility] [address] Pt number is [phone].

## 2024-09-17 NOTE — Telephone Encounter (Signed)
 done

## 2024-09-18 ENCOUNTER — Ambulatory Visit: Admitting: Gastroenterology

## 2024-09-23 ENCOUNTER — Other Ambulatory Visit: Payer: Self-pay

## 2024-09-23 ENCOUNTER — Encounter (HOSPITAL_COMMUNITY): Payer: Self-pay

## 2024-09-23 ENCOUNTER — Emergency Department (HOSPITAL_COMMUNITY)

## 2024-09-23 ENCOUNTER — Emergency Department (HOSPITAL_COMMUNITY): Admission: EM | Admit: 2024-09-23 | Discharge: 2024-09-23 | Disposition: A | Discharge status: Home / Self Care

## 2024-09-23 DIAGNOSIS — S9031XA Contusion of right foot, initial encounter: Secondary | ICD-10-CM | POA: Diagnosis not present

## 2024-09-23 DIAGNOSIS — W208XXA Other cause of strike by thrown, projected or falling object, initial encounter: Secondary | ICD-10-CM | POA: Insufficient documentation

## 2024-09-23 DIAGNOSIS — Z7901 Long term (current) use of anticoagulants: Secondary | ICD-10-CM | POA: Insufficient documentation

## 2024-09-23 DIAGNOSIS — M79671 Pain in right foot: Secondary | ICD-10-CM | POA: Diagnosis present

## 2024-09-23 MED ORDER — ACETAMINOPHEN 500 MG PO TABS
1000.0000 mg | ORAL_TABLET | Freq: Once | ORAL | Status: AC
  Administered 2024-09-23: 1000 mg via ORAL
  Filled 2024-09-23: qty 2

## 2024-09-23 NOTE — ED Triage Notes (Signed)
 Pt states a window pane fell on her right foot yesterday. Cms intact. Pt AxOx4.

## 2024-09-23 NOTE — Progress Notes (Signed)
 Orthopedic Tech Progress Note Patient Details:  Shelley [name] 23-Aug-1956 [id number] Complications with ambulation. Needed a wheelchair for assistance.  Ortho Devices Type of Ortho Device: CAM walker Ortho Device/Splint Location: R ANKLE Ortho Device/Splint Interventions: Ordered, Application   Post Interventions Patient Tolerated: Well Instructions Provided: Care of device  Shelley [name] 09/23/2024, 4:00 PM

## 2024-09-23 NOTE — ED Notes (Signed)
 Paper work reviewed with pt. Pt is in no new onset distress and wishes to go for home. Pt will be taken to lobby by wheelchair after boot is applied.

## 2024-09-23 NOTE — Discharge Instructions (Addendum)
 Follow-up with your orthopedist if pain continues.  You may continue to wear the boot for comfort.  Continue to rest, ice, compress and elevate your foot.  If you have any more pain you may take Tylenol  as needed.  If you start to notice any numbness or tingling or increasing in pain return to the ER.

## 2024-09-23 NOTE — ED Provider Triage Note (Signed)
 Emergency Medicine Provider Triage Evaluation Note  Shelley [name] , a 68 y.o. female  was evaluated in triage.  Pt complains of foot injury. A window pane fell and struck dorsum of R foot yesterday.  Increasing throbbing pain since.  No other injury, no numbness  Review of Systems  Positive: As above Negative: As above  Physical Exam  BP 133/69 (BP Location: Left Arm)   Pulse 85   Temp 98.1 F (36.7 C) (Oral)   Resp 16   LMP  (LMP Unknown)   SpO2 100%  Gen:   Awake, no distress   Resp:  Normal effort  MSK:   Moves extremities without difficulty  Other:  R foot ttp mid foot  Medical Decision Making  Medically screening exam initiated at 11:12 AM.  Appropriate orders placed.  Shelley [name] was informed that the remainder of the evaluation will be completed by another provider, this initial triage assessment does not replace that evaluation, and the importance of remaining in the ED until their evaluation is complete.     Shelley Colon, PA-C 09/23/24 1112

## 2024-09-23 NOTE — ED Provider Notes (Signed)
 " [place] EMERGENCY DEPARTMENT AT [facility] Provider Note   CSN: [id number] Arrival date & time: 09/23/24  9148     Patient presents with: Foot Injury   Shelley [name] is a 68 y.o. female.    Foot Injury  68 year old female presenting with foot pain.  Patient states that yesterday she dropped a screen window pain on her foot.  She reports that she has had quite a bit of pain since then.  She has not taken anything for the pain.  She feels that she is unable to walk on the foot.  She has never had anything like this happen before.  She denies any other pain.  She denies any numbness or tingling.     Prior to Admission medications  Medication Sig Start Date End Date Taking? Authorizing Provider  apixaban  (ELIQUIS ) 2.5 MG TABS tablet Take 1 tablet (2.5 mg total) by mouth 2 (two) times daily for 30 days after surgery to prevent blood clots 04/30/24   Jule Ronal CROME, PA-C  amLODipine  (NORVASC ) 10 MG tablet TAKE 1 TABLET(10 MG) BY MOUTH DAILY 04/24/24   Louder Barnie NOVAK, MD  Boswellia-Glucosamine-Vit D (OSTEO BI-FLEX ONE PER DAY) TABS Take 1 tablet by mouth 3 (three) times a week.    [provider]  Cholecalciferol (VITAMIN D3) 25 MCG (1000 UT) CHEW Chew 1,000 Units by mouth 3 (three) times a week.    [provider]  docusate sodium  (COLACE) 100 MG capsule Take 1 capsule (100 mg total) by mouth daily as needed. 04/19/24 04/19/25  Jule Ronal CROME, PA-C  HYDROcodone -acetaminophen  (NORCO/VICODIN) 5-325 MG tablet Take 1-2 tablets by mouth daily as needed. 06/12/24   Jule Ronal CROME, PA-C  methocarbamol  (ROBAXIN ) 750 MG tablet Take 1 tablet (750 mg total) by mouth 3 (three) times daily as needed. 04/19/24   Jule Ronal CROME, PA-C  Multiple Vitamin (MULTIVITAMIN WITH MINERALS) TABS tablet Take 1 tablet by mouth daily.    [provider]  ondansetron  (ZOFRAN ) 4 MG tablet Take 1 tablet (4 mg total) by mouth every 8 (eight) hours as needed for nausea or  vomiting. 04/19/24   Jule Ronal CROME, PA-C  oxyCODONE -acetaminophen  (PERCOCET) 5-325 MG tablet Take 1-2 tablets by mouth every 8 (eight) hours as needed. To be taken after surgery 05/04/24   Jule Ronal CROME, PA-C  pravastatin  (PRAVACHOL ) 10 MG tablet Take 1 tab PO Q Mon/Wed/Fri 08/04/23   Louder Barnie NOVAK, MD  traMADol  (ULTRAM ) 50 MG tablet Take 1-2 tablets (50-100 mg total) by mouth daily as needed. 09/17/24   Jerri Kay HERO, MD  valsartan  (DIOVAN ) 40 MG tablet Take 1 tablet (40 mg total) by mouth daily. 04/24/24   Louder Barnie NOVAK, MD    Allergies: Meloxicam , Metronidazole , Cephalexin, and Ciprofloxacin     Review of Systems  Updated Vital Signs BP 133/69 (BP Location: Left Arm)   Pulse 85   Temp 98.1 F (36.7 C) (Oral)   Resp 16   LMP  (LMP Unknown)   SpO2 100%   Physical Exam Vitals and nursing note reviewed.  Eyes:     General: No scleral icterus. Cardiovascular:     Pulses:          Dorsalis pedis pulses are 2+ on the right side.  Pulmonary:     Effort: Pulmonary effort is normal.  Musculoskeletal:       Feet:  Feet:     Right foot:     Skin integrity: Skin integrity normal.  Comments: Patient had pain to palpation over the medial aspect of the foot.  Patient had pain to movement of the toes.  However was able to move toes.  Pedal pulses were 2+.  Patient was able to feel me touching on both the dorsum of the foot and toes.  Patient was able to move ankle with pain.  No erythema noted.  No heat noted.  No ulcers noted Skin:    Coloration: Skin is not jaundiced.     Findings: No rash.  Neurological:     General: No focal deficit present.     Mental Status: She is alert.     (all labs ordered are listed, but only abnormal results are displayed) Labs Reviewed - No data to display  EKG: None  Radiology: DG Foot Complete Right Result Date: 09/23/2024 EXAM: 3 OR MORE VIEW(S) XRAY OF THE FOOT 09/23/2024 12:21:00 PM COMPARISON: None available. CLINICAL HISTORY:  Injury FINDINGS: BONES AND JOINTS: No acute fracture. Mild hallux valgus deformity. 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 SOFT TISSUES: The soft tissues are unremarkable. IMPRESSION: 1. No evidence of acute traumatic injury. Electronically signed by: Waddell Calk MD 09/23/2024 12:27 PM EST RP Workstation: [id number]     Procedures   Medications Ordered in the ED  acetaminophen  (TYLENOL ) tablet 1,000 mg (1,000 mg Oral Given 09/23/24 1141)    Clinical Course as of 09/23/24 1242  Sun Sep 23, 2024  1237 Right foot pain. Physical exam reassuring. Xray reassuring. Patient requesting CAM boot but does not want crutches. -SFM [SM]    Clinical Course User Index [SM] Hoy Nidia FALCON, PA-C                                 Medical Decision Making Amount and/or Complexity of Data Reviewed Radiology: ordered.   Impression: 68 year old female presenting with foot pain.  Differential diagnoses include fracture, sprain, osteomyelitis, diabetic foot ulcers  Additional History: Patient was able to give history.  I also reviewed her outpatient visits.  Labs: None  Imaging: I personally interpreted x-ray of the foot and found no acute fractures.  ED Course/Meds: Patient remained stable while in the ER.  X-ray showed that patient had no acute fractures in her foot.  Patient received 1000 mg of Tylenol  while in the waiting room.  After discussing with patient she says that she would like a boot for comfort.  Patient was placed in a cam boot and told to rest, ice, compress and elevate her foot.  If she has any pain she can continue Tylenol .  If pain worsens or she starts to notice any numbness or tingling she can return to the ER.      Final diagnoses:  Contusion of right foot, initial encounter    ED Discharge Orders     None          Shelley [name] 09/23/24 1242    Ula Prentice SAUNDERS, MD 09/23/24 1510  "

## 2024-09-23 NOTE — ED Notes (Signed)
 Ortho tech called for palcement of CAM boot.

## 2024-10-02 NOTE — Telephone Encounter (Signed)
 Spoke with patient about her upcoming surgery.

## 2024-10-16 ENCOUNTER — Ambulatory Visit: Attending: Internal Medicine

## 2024-10-16 VITALS — Ht 67.0 in | Wt 123.0 lb

## 2024-10-16 DIAGNOSIS — Z0000 Encounter for general adult medical examination without abnormal findings: Secondary | ICD-10-CM | POA: Diagnosis not present

## 2024-10-16 NOTE — Patient Instructions (Signed)
 Shelley [name],  Thank you for taking the time for your Medicare Wellness Visit. I appreciate your continued commitment to your health goals. Please review the care plan we discussed, and feel free to reach out if I can assist you further.  Please note that Annual Wellness Visits do not include a physical exam. Some assessments may be limited, especially if the visit was conducted virtually. If needed, we may recommend an in-person follow-up with your provider.  Ongoing Care Seeing your primary care provider every 3 to 6 months helps us  monitor your health and provide consistent, personalized care.   Referrals If a referral was made during today's visit and you haven't received any updates within two weeks, please contact the referred provider directly to check on the status.  Recommended Screenings:  Health Maintenance  Topic Date Due   Pneumococcal Vaccine for age over 40 (1 of 2 - PCV) Never done   Zoster (Shingles) Vaccine (1 of 2) Never done   Osteoporosis screening with Bone Density Scan  Never done   Breast Cancer Screening  12/03/2022   Medicare Annual Wellness Visit  11/19/2023   Colon Cancer Screening  02/13/2024   Flu Shot  Never done   COVID-19 Vaccine (3 - 2025-26 season) 06/04/2024   DTaP/Tdap/Td vaccine (2 - Td or Tdap) 02/20/2032   Hepatitis C Screening  Completed   Meningitis B Vaccine  Aged Out   Hepatitis B Vaccine  Discontinued       10/16/2024   10:40 AM  Advanced Directives  Does Patient Have a Medical Advance Directive? No  Would patient like information on creating a medical advance directive? No - Patient declined    Vision: Annual vision screenings are recommended for early detection of glaucoma, cataracts, and diabetic retinopathy. These exams can also reveal signs of chronic conditions such as diabetes and high blood pressure.  Dental: Annual dental screenings help detect early signs of oral cancer, gum disease, and other conditions linked to overall  health, including heart disease and diabetes.  Please see the attached documents for additional preventive care recommendations.

## 2024-10-16 NOTE — Progress Notes (Signed)
 "  Chief Complaint  Patient presents with   Medicare Wellness    SUBSEQUENT      Subjective:   Shelley [name] is a 69 y.o. female who presents for a Medicare Annual Wellness Visit.  Visit info / Clinical Intake: Medicare Wellness Visit Type:: Subsequent Annual Wellness Visit Persons participating in visit and providing information:: patient Medicare Wellness Visit Mode:: Telephone If telephone:: video declined Since this visit was completed virtually, some vitals may be partially provided or unavailable. Missing vitals are due to the limitations of the virtual format.: Documented vitals are patient reported If Telephone or Video please confirm:: I connected with patient using audio/video enable telemedicine. I verified patient identity with two identifiers, discussed telehealth limitations, and patient agreed to proceed. Patient Location:: Home Provider Location:: Office Interpreter Needed?: No Pre-visit prep was completed: yes AWV questionnaire completed by patient prior to visit?: no Living arrangements:: (!) lives alone Patient's Overall Health Status Rating: good Typical amount of pain: (!) a lot Does pain affect daily life?: (!) yes Are you currently prescribed opioids?: (!) yes  Dietary Habits and Nutritional Risks How many meals a day?: 3 (1-3 meals per day) Eats fruit and vegetables daily?: yes Most meals are obtained by: preparing own meals In the last 2 weeks, have you had any of the following?: none Diabetic:: no  Functional Status Activities of Daily Living (to include ambulation/medication): Independent Ambulation: Independent Medication Administration: Independent Home Management (perform basic housework or laundry): Independent Manage your own finances?: yes Primary transportation is: driving Concerns about vision?: no *vision screening is required for WTM* Concerns about hearing?: no  Fall Screening Falls in the past year?: 0 Number of falls in past  year: 0 Was there an injury with Fall?: 0 Fall Risk Category Calculator: 0 Patient Fall Risk Level: Low Fall Risk  Fall Risk Patient at Risk for Falls Due to: No Fall Risks Fall risk Follow up: Falls evaluation completed; Education provided  Home and Transportation Safety: All rugs have non-skid backing?: yes All stairs or steps have railings?: yes (6 steps) Grab bars in the bathtub or shower?: (!) no Have non-skid surface in bathtub or shower?: (!) no Good home lighting?: (!) no Regular seat belt use?: yes Hospital stays in the last year:: no  Cognitive Assessment Difficulty concentrating, remembering, or making decisions? : no Will 6CIT or Mini Cog be Completed: yes What year is it?: 0 points What month is it?: 0 points Give patient an address phrase to remember (5 components): [address] [place] what time is it?: 0 points Count backwards from 20 to 1: 0 points Say the months of the year in reverse: 0 points Repeat the address phrase from earlier: 0 points 6 CIT Score: 0 points  Advance Directives (For Healthcare) Does Patient Have a Medical Advance Directive?: No Would patient like information on creating a medical advance directive?: No - Patient declined  Reviewed/Updated  Reviewed/Updated: Reviewed All (Medical, Surgical, Family, Medications, Allergies, Care Teams, Patient Goals)    Allergies (verified) Meloxicam , Metronidazole , Cephalexin, and Ciprofloxacin    Current Medications (verified) Outpatient Encounter Medications as of 10/16/2024  Medication Sig   apixaban  (ELIQUIS ) 2.5 MG TABS tablet Take 1 tablet (2.5 mg total) by mouth 2 (two) times daily for 30 days after surgery to prevent blood clots   amLODipine  (NORVASC ) 10 MG tablet TAKE 1 TABLET(10 MG) BY MOUTH DAILY   Boswellia-Glucosamine-Vit D (OSTEO BI-FLEX ONE PER DAY) TABS Take 1 tablet by mouth 3 (three) times  a week.   Cholecalciferol (VITAMIN D3) 25 MCG (1000 UT) CHEW Chew 1,000 Units  by mouth 3 (three) times a week.   docusate sodium  (COLACE) 100 MG capsule Take 1 capsule (100 mg total) by mouth daily as needed.   HYDROcodone -acetaminophen  (NORCO/VICODIN) 5-325 MG tablet Take 1-2 tablets by mouth daily as needed.   methocarbamol  (ROBAXIN ) 750 MG tablet Take 1 tablet (750 mg total) by mouth 3 (three) times daily as needed.   Multiple Vitamin (MULTIVITAMIN WITH MINERALS) TABS tablet Take 1 tablet by mouth daily.   ondansetron  (ZOFRAN ) 4 MG tablet Take 1 tablet (4 mg total) by mouth every 8 (eight) hours as needed for nausea or vomiting.   oxyCODONE -acetaminophen  (PERCOCET) 5-325 MG tablet Take 1-2 tablets by mouth every 8 (eight) hours as needed. To be taken after surgery   pravastatin  (PRAVACHOL ) 10 MG tablet Take 1 tab PO Q Mon/Wed/Fri   traMADol  (ULTRAM ) 50 MG tablet Take 1-2 tablets (50-100 mg total) by mouth daily as needed.   valsartan  (DIOVAN ) 40 MG tablet Take 1 tablet (40 mg total) by mouth daily.   No facility-administered encounter medications on file as of 10/16/2024.    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   Family History  Problem Relation Age of Onset   Heart disease Mother    Hypertension Mother    Congestive Heart Failure Mother        Cause of death   Congestive Heart Failure Father    Stroke Father        Cause of death   Hypertension Father    Alcohol abuse  Brother    Hypertension Brother    Heart disease Maternal Grandmother    Stroke Maternal Grandmother    Diabetes Sister    Hypertension Sister    Heart disease Sister        Arrhythmia and myocarditis/Congenital Heart Disease/possible arrhythmia   Diabetes Paternal Grandmother    Diabetes Paternal Grandfather    Colitis Neg Hx    Esophageal cancer Neg Hx    Stomach cancer Neg Hx    Rectal cancer Neg Hx    Social History   Occupational History    Comment: 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  Looking to get employment with childcare.   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    Comment: Retired human resources officer  Tobacco Use   Smoking status: Some Days    Types: Cigarettes    Start date: 10/09/1990   Smokeless tobacco: Never   Tobacco comments:    1 cigg once a week  Vaping Use   Vaping status: Never Used  Substance and Sexual Activity   Alcohol use: Yes    Alcohol/week: 6.0 standard drinks of alcohol    Types: 6 Shots of liquor per week   Drug use: Yes  Types: Marijuana   Sexual activity: Yes    Birth control/protection: None   Tobacco Counseling Ready to quit: Not Answered Counseling given: Not Answered Tobacco comments: 1 cigg once a week  SDOH Screenings   Food Insecurity: No Food Insecurity (10/16/2024)  Housing: Low Risk (10/16/2024)  Transportation Needs: No Transportation Needs (10/16/2024)  Utilities: Not At Risk (10/16/2024)  Alcohol Screen: Low Risk (10/16/2024)  Depression (PHQ2-9): Low Risk (10/16/2024)  Financial Resource Strain: Low Risk (10/16/2024)  Physical Activity: Sufficiently Active (10/16/2024)  Social Connections: Moderately Integrated (10/16/2024)  Stress: No Stress Concern Present (10/16/2024)  Tobacco Use: High Risk (10/16/2024)  Health Literacy: Adequate Health Literacy (10/16/2024)   See flowsheets for full screening details  Depression Screen PHQ 2 & 9 Depression Scale- Over the past 2 weeks, how often have you  been bothered by any of the following problems? Little interest or pleasure in doing things: 0 Feeling down, depressed, or hopeless (PHQ Adolescent also includes...irritable): 0 PHQ-2 Total Score: 0 Trouble falling or staying asleep, or sleeping too much: 0 Feeling tired or having little energy: 0 Poor appetite or overeating (PHQ Adolescent also includes...weight loss): 0 Feeling bad about yourself - or that you are a failure or have let yourself or your family down: 0 Trouble concentrating on things, such as reading the newspaper or watching television (PHQ Adolescent also includes...like school work): 0 Moving or speaking so slowly that other people could have noticed. Or the opposite - being so fidgety or restless that you have been moving around a lot more than usual: 0 Thoughts that you would be better off dead, or of hurting yourself in some way: 0 PHQ-9 Total Score: 0 If you checked off any problems, how difficult have these problems made it for you to do your work, take care of things at home, or get along with other people?: Not difficult at all  Depression Treatment Depression Interventions/Treatment : EYV7-0 Score <4 Follow-up Not Indicated     Goals Addressed             This Visit's Progress    10/16/2024: To continue to maintain and stay independent.               Objective:    Today's Vitals   10/16/24 1036  Weight: 123 lb (55.8 kg)  Height: 5' 7 (1.702 m)  PainSc: 6   PainLoc: Hip   Body mass index is 19.26 kg/m.  Hearing/Vision screen No results found. Immunizations and Health Maintenance Health Maintenance  Topic Date Due   Pneumococcal Vaccine: 50+ Years (1 of 2 - PCV) Never done   Zoster Vaccines- Shingrix (1 of 2) Never done   Bone Density Scan  Never done   Mammogram  12/03/2022   Colonoscopy  02/13/2024   Influenza Vaccine  Never done   COVID-19 Vaccine (3 - 2025-26 season) 06/04/2024   Medicare Annual Wellness (AWV)  10/16/2025    DTaP/Tdap/Td (2 - Td or Tdap) 02/20/2032   Hepatitis C Screening  Completed   Meningococcal B Vaccine  Aged Out   Hepatitis B Vaccines 19-59 Average Risk  Discontinued        Assessment/Plan:  This is a routine wellness examination for Shelley [name].  Patient Care Team: Vicci Barnie NOVAK, MD as PCP - General (Internal Medicine)  I have personally reviewed and noted the following in the patients chart:   Medical and social history Use of alcohol, tobacco or illicit drugs  Current medications and supplements including opioid prescriptions. Functional  ability and status Nutritional status Physical activity Advanced directives List of other physicians Hospitalizations, surgeries, and ER visits in previous 12 months Vitals Screenings to include cognitive, depression, and falls Referrals and appointments  No orders of the defined types were placed in this encounter.  In addition, I have reviewed and discussed with patient certain preventive protocols, quality metrics, and best practice recommendations. A written personalized care plan for preventive services as well as general preventive health recommendations were provided to patient.   Shelley LOISE Fuller, LPN   8/86/7973   Return in about 1 year (around 10/16/2025) for Medicare wellness.  After Visit Summary: (MyChart) Due to this being a telephonic visit, the after visit summary with patients personalized plan was offered to patient via MyChart   Nurse Notes:  HM Addressed: Patient stated she is aware of current care gaps, will discuss at next appointment with Dr. Vicci.  "

## 2024-10-18 ENCOUNTER — Ambulatory Visit: Admitting: Gastroenterology

## 2024-10-18 NOTE — Progress Notes (Deleted)
 Shelley [name]

## 2024-10-20 ENCOUNTER — Other Ambulatory Visit: Payer: Self-pay | Admitting: Internal Medicine

## 2024-10-20 DIAGNOSIS — I10 Essential (primary) hypertension: Secondary | ICD-10-CM

## 2024-10-21 ENCOUNTER — Other Ambulatory Visit: Payer: Self-pay | Admitting: Internal Medicine

## 2024-10-21 DIAGNOSIS — I10 Essential (primary) hypertension: Secondary | ICD-10-CM

## 2024-11-01 ENCOUNTER — Ambulatory Visit: Admitting: Internal Medicine

## 2024-11-29 ENCOUNTER — Ambulatory Visit: Admitting: Internal Medicine
